# Patient Record
Sex: Female | Born: 1980 | Race: White | Hispanic: No | Marital: Married | Smoking: Never smoker
Health system: Southern US, Community
[De-identification: ages and names within clinical notes are randomized; demographics above are authoritative.]

## PROBLEM LIST (undated history)

## (undated) DIAGNOSIS — N200 Calculus of kidney: Secondary | ICD-10-CM

## (undated) DIAGNOSIS — N289 Disorder of kidney and ureter, unspecified: Secondary | ICD-10-CM

## (undated) DIAGNOSIS — K219 Gastro-esophageal reflux disease without esophagitis: Secondary | ICD-10-CM

## (undated) HISTORY — PX: BREAST REDUCTION SURGERY: SHX8

---

## 1998-08-08 ENCOUNTER — Other Ambulatory Visit: Admission: RE | Admit: 1998-08-08 | Discharge: 1998-08-08 | Payer: Self-pay | Admitting: Obstetrics and Gynecology

## 1999-09-21 ENCOUNTER — Other Ambulatory Visit: Admission: RE | Admit: 1999-09-21 | Discharge: 1999-09-21 | Payer: Self-pay | Admitting: Obstetrics and Gynecology

## 2000-11-07 ENCOUNTER — Other Ambulatory Visit: Admission: RE | Admit: 2000-11-07 | Discharge: 2000-11-07 | Payer: Self-pay | Admitting: Obstetrics and Gynecology

## 2000-12-12 ENCOUNTER — Other Ambulatory Visit: Admission: RE | Admit: 2000-12-12 | Discharge: 2000-12-12 | Payer: Self-pay | Admitting: Obstetrics and Gynecology

## 2001-05-01 ENCOUNTER — Other Ambulatory Visit: Admission: RE | Admit: 2001-05-01 | Discharge: 2001-05-01 | Payer: Self-pay | Admitting: Obstetrics and Gynecology

## 2001-10-21 ENCOUNTER — Emergency Department (HOSPITAL_COMMUNITY): Admission: EM | Admit: 2001-10-21 | Discharge: 2001-10-21 | Payer: Self-pay | Admitting: Emergency Medicine

## 2001-10-21 ENCOUNTER — Encounter: Payer: Self-pay | Admitting: Emergency Medicine

## 2001-11-27 ENCOUNTER — Other Ambulatory Visit: Admission: RE | Admit: 2001-11-27 | Discharge: 2001-11-27 | Payer: Self-pay | Admitting: Obstetrics and Gynecology

## 2001-12-02 ENCOUNTER — Encounter: Payer: Self-pay | Admitting: Emergency Medicine

## 2001-12-02 ENCOUNTER — Observation Stay (HOSPITAL_COMMUNITY): Admission: EM | Admit: 2001-12-02 | Discharge: 2001-12-03 | Payer: Self-pay | Admitting: Emergency Medicine

## 2002-07-11 ENCOUNTER — Inpatient Hospital Stay (HOSPITAL_COMMUNITY): Admission: AD | Admit: 2002-07-11 | Discharge: 2002-07-13 | Payer: Self-pay | Admitting: Obstetrics and Gynecology

## 2002-11-08 ENCOUNTER — Ambulatory Visit (HOSPITAL_BASED_OUTPATIENT_CLINIC_OR_DEPARTMENT_OTHER): Admission: RE | Admit: 2002-11-08 | Discharge: 2002-11-08 | Payer: Self-pay | Admitting: Urology

## 2002-11-18 ENCOUNTER — Ambulatory Visit (HOSPITAL_COMMUNITY): Admission: RE | Admit: 2002-11-18 | Discharge: 2002-11-18 | Payer: Self-pay | Admitting: Urology

## 2002-11-18 ENCOUNTER — Encounter: Payer: Self-pay | Admitting: Urology

## 2003-02-11 ENCOUNTER — Emergency Department (HOSPITAL_COMMUNITY): Admission: EM | Admit: 2003-02-11 | Discharge: 2003-02-12 | Payer: Self-pay | Admitting: Emergency Medicine

## 2003-03-04 ENCOUNTER — Other Ambulatory Visit: Admission: RE | Admit: 2003-03-04 | Discharge: 2003-03-04 | Payer: Self-pay | Admitting: Obstetrics and Gynecology

## 2004-03-23 ENCOUNTER — Other Ambulatory Visit: Admission: RE | Admit: 2004-03-23 | Discharge: 2004-03-23 | Payer: Self-pay | Admitting: Obstetrics and Gynecology

## 2005-05-24 ENCOUNTER — Other Ambulatory Visit: Admission: RE | Admit: 2005-05-24 | Discharge: 2005-05-24 | Payer: Self-pay | Admitting: Obstetrics and Gynecology

## 2006-05-29 ENCOUNTER — Inpatient Hospital Stay (HOSPITAL_COMMUNITY): Admission: AD | Admit: 2006-05-29 | Discharge: 2006-05-29 | Payer: Self-pay | Admitting: Obstetrics and Gynecology

## 2006-06-02 ENCOUNTER — Inpatient Hospital Stay (HOSPITAL_COMMUNITY): Admission: AD | Admit: 2006-06-02 | Discharge: 2006-06-02 | Payer: Self-pay | Admitting: Obstetrics and Gynecology

## 2006-06-06 ENCOUNTER — Inpatient Hospital Stay (HOSPITAL_COMMUNITY): Admission: RE | Admit: 2006-06-06 | Discharge: 2006-06-08 | Payer: Self-pay | Admitting: Obstetrics & Gynecology

## 2007-03-30 ENCOUNTER — Ambulatory Visit (HOSPITAL_COMMUNITY): Admission: RE | Admit: 2007-03-30 | Discharge: 2007-03-30 | Payer: Self-pay | Admitting: Urology

## 2009-03-30 ENCOUNTER — Ambulatory Visit (HOSPITAL_COMMUNITY): Admission: RE | Admit: 2009-03-30 | Discharge: 2009-03-30 | Payer: Self-pay | Admitting: Urology

## 2010-05-16 LAB — PREGNANCY, URINE: Preg Test, Ur: NEGATIVE

## 2010-07-10 NOTE — Op Note (Signed)
NAME:  Heather Knight, Heather Knight                  ACCOUNT NO.:  [id number]   MEDICAL RECORD NO.:  [id number]          PATIENT TYPE:  AMB   LOCATION:  DAY                          FACILITY:  [facility]   PHYSICIAN:  Valetta Fuller, M.D.  DATE OF BIRTH:  [birth year]   DATE OF PROCEDURE:  DATE OF DISCHARGE:                               OPERATIVE REPORT   PREOPERATIVE DIAGNOSIS:  Multiple left ureteral calculi.   POSTOPERATIVE DIAGNOSIS:  Multiple left ureteral calculi.   PROCEDURE PERFORMED:  Cystoscopy, left retrograde pyelography, left  ureteroscopy with holmium laser lithotripsy, basketing of the stone  fragments and double-J stent placement.   SURGEON:  Dr. Barron Alvine.   ANESTHESIA:  General.   INDICATIONS:  Heather Knight is a 30 year old female.  She presented urgently  to our office in my absence and saw one of the nurse practitioners.  She  has had a history of nephrolithiasis and has also had treatment for  stones on several occasions.  She had presented with several days of  left flank pain.  The patient's pain was fairly severe at that time.  An  urgent CT scan revealed what appeared to be two ureteral calculi.  She  had an approximately 5 mm left mid to proximal ureteral calculus and a  smaller 3 mm stone in the distal ureter.  There was moderate  hydronephrosis.  The patient also had a nonobstructing stone in the mid  to lower pole of her right kidney which was 5-6 mm in size.  At that  time I did discuss with the patient after her evaluation with the nurse  practitioner treatment options.  Because of some pending family issues,  she wanted to try to deal with the stone definitively.  Given the  multiple stones, we felt that lithotripsy was probably not her best  option and that she ought to proceed was ureteroscopy.  She appeared to  understand the advantages and disadvantages of that approach.   TECHNIQUE AND FINDINGS:  The patient was brought to the operating room  where she had  successful induction of general anesthesia.  She was  placed in lithotomy position, prepped and draped in the usual manner.  Cystoscopy was performed and was unremarkable.   A retrograde pyelogram was done with an open-ended catheter.  The  patient clearly had a 3-4 mm filling defect in her distal ureter just  above ureterovesical junction.  There was a second filling defect  several centimeters above this with mild to moderate ureteral dilation.  This was interpreted with fluoroscopic interpretation at the time of the  procedure.   After retrograde, a guidewire was placed up to the left renal pelvis.  Initial attempts at engaging the left distal ureter were successful, but  I was unable to really advance the scope more than about 2 cm due to  some edema.  For that reason, I went ahead removed the ureteroscope and  over the guidewire performed a one-step dilation with the inside portion  of an access sheath.  Once that was completed, I was able to easily re-  engage the ureteroscope.  Several centimeters above the ureterovesical  junction, a 3-4 mm stone was encountered.  Just above that there was a 6  mm stone. We felt this would be best treated with holmium laser  lithotripsy.  A 365 micron fiber was placed through the scope.  The  stone was broken up into approximately a dozen pieces. The largest  pieces which were about 2 mm in size were basket extracted.  The rest of  the fragments all flushed out nicely.  There was no evidence of any  ureteral trauma or injury.  Because of the need for dilation, we did  feel that temporary double-J stent placement should be  performed.  Over the guidewire, we placed a 5-French 24 cm Polaris stent  with fluoroscopic as well as visual guidance.  The patient appeared to  tolerate the procedure well.  There did not appear to be any obvious  complications or problems and she was brought to the recovery room in  stable condition.            ______________________________  Valetta Fuller, M.D.  Electronically Signed     DSG/MEDQ  D:  03/30/2007  T:  03/31/2007  Job:  [id number]

## 2010-07-13 NOTE — Op Note (Signed)
NAME:  Heather Knight, Heather Knight                            ACCOUNT NO.:  [id number]   MEDICAL RECORD NO.:  [id number]                   PATIENT TYPE:  AMB   LOCATION:  [facility]                                 FACILITY:  [facility]   PHYSICIAN:  Valetta Fuller, M.D.               DATE OF BIRTH:  1981/01/18   DATE OF PROCEDURE:  11/08/2002  DATE OF DISCHARGE:                                 OPERATIVE REPORT   PREOPERATIVE DIAGNOSES:  1. Right ureteropelvic junction stone.  2. 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  3. Questionable right distal ureteral stone.   POSTOPERATIVE DIAGNOSES:  1. Right ureteropelvic junction obstruction.  2. Right proximal ureteral stone.   OPERATION/PROCEDURE:  1. Cystoscopy.  2. Radical retropubic prostatectomy.  3. Right ureteroscopy.  4. Right double-J stent placement.   SURGEON:  Valetta Fuller, M.D.   ASSISTANT:  Susanne Borders, M.D.   ANESTHESIA:  General endotracheal anesthesia.   COMPLICATIONS:  None.   DRAINS:  7-French x 24 cm double-J ureteral stent.   INDICATIONS FOR PROCEDURE:  Heather Knight is a 30 year old with a history of  right flank pain approximately one year ago when she was pregnant.  There  was a questionable ureteropelvic junction stone at that time.  The patient's  pain resolved and the patient was not heard from until recently when she has  been having right flank pain again and she was referred to Heather Knight.  She  underwent a CT scan which demonstrated significant right hydronephrosis and  a stone at about the level of the right ureteropelvic junction.  There is  also questionable punctate ureterovesical junction stone.  After  understanding the risks and benefits, the patient consented to the above  procedures for possible diagnosis of right ureteropelvic junction  obstruction and possible treatment of a right ureteropelvic junction stone.   DESCRIPTION OF PROCEDURE:  The patient was brought to the operating room and  correctly identified by her identification.  She was given preoperative  antibiotics and general endotracheal anesthesia.  She was placed in the  dorsal lithotomy position and prepped and draped in the typical sterile  fashion.  A 22-French cystoscope sheath with 12-degree lens was used to  enter the patient's urethra and bladder.  Careful systematic cystoscopy was  performed which demonstrated some erythema at the level of the trigone, but  no other mucosal abnormalities.  Bilateral ureteral orifices were identified  in their normal anatomic location, each effluxing clear urine.  A cone-tip  ureteral catheter was used to perform right retrograde pyelogram.  After  injecting approximately 25 mL of contrast, the anatomy of the patient's  right collecting system, ureter was better identified.  The patient had very  significant right hydronephrosis with apparent high insertion of her right  ureter and a transition point at a level of the ureteropelvic junction.  She  also had a second transition point approximately  5 cm distal to the  ureteropelvic junction at which point the stone that was identified on the  CT scan was seen.  There was an area of small segment of narrowing just  distal to the stone.  Past this point, ureter was somewhat dilated all the  way down to the level of the urethrovesical junction.  Next, a 0.38 mm guide  wire was passed through the cystoscope and into the urethra and up into the  right renal pelvis under fluoroscopic guidance.  Cystoscope was removed and  the rigid ureteroscope was passed into the patient's bladder, into her right  ureter.  There was no evidence of calculi in the distal ureter.  The  cystoscope could be passed quite easily all the way up to the level of her  proximal ureter secondary to the ureteronephrosis that was present.  The  area of narrowing in the proximal ureter was seen and this did not appear to  be a stricture per se but possibly edema  from the stone that was just  proximal to it.  The ureteroscope was removed and access sheath was  attempted to be passed up past the stone and into the ureteropelvic junction  but this could not be done.  The ureteroscope was replaced and again we were  not able to pass the scope past this distal area of narrowing.  The stone  could be visualized, however, just proximal to this level.  Decision was  made at that point to place a stent and treat the patient's stone at a later  date with either extracorporeal shock wave lithotripsy.   The ureteroscope was removed and cystoscope was backloaded over the guide  wire.  A 7-French x 24 cm double-J stent was passed over the guide wire.  The proximal end was curled in the right renal pelvis as visualized on  fluoroscopy and the distal end curled nicely in the bladder as seen under  direct vision.  The patient's bladder was then drained and the cystoscope  was removed.  A B&O suppository was administered.  The patient was awakened  from her anesthesia without complications and taken to the post anesthesia  care unit.   Please note that Heather Knight was present and participated in all aspects of  this case as he was the primary surgeon.        Susanne Borders, MD                           Valetta Fuller, M.D.    DR/MEDQ  D:  11/08/2002  T:  11/08/2002  Job:  [id number]

## 2010-11-16 LAB — HEMOGLOBIN AND HEMATOCRIT, BLOOD: Hemoglobin: 12.9

## 2012-08-16 ENCOUNTER — Encounter (HOSPITAL_COMMUNITY): Payer: Self-pay

## 2012-08-16 ENCOUNTER — Emergency Department (HOSPITAL_COMMUNITY)
Admission: EM | Admit: 2012-08-16 | Discharge: 2012-08-16 | Disposition: A | Discharge status: Home / Self Care | Payer: 59 | Attending: Emergency Medicine | Admitting: Emergency Medicine

## 2012-08-16 ENCOUNTER — Emergency Department (HOSPITAL_COMMUNITY): Payer: 59

## 2012-08-16 DIAGNOSIS — Z79899 Other long term (current) drug therapy: Secondary | ICD-10-CM | POA: Insufficient documentation

## 2012-08-16 DIAGNOSIS — R1013 Epigastric pain: Secondary | ICD-10-CM | POA: Insufficient documentation

## 2012-08-16 DIAGNOSIS — Z3202 Encounter for pregnancy test, result negative: Secondary | ICD-10-CM | POA: Insufficient documentation

## 2012-08-16 DIAGNOSIS — R110 Nausea: Secondary | ICD-10-CM | POA: Insufficient documentation

## 2012-08-16 DIAGNOSIS — Z87448 Personal history of other diseases of urinary system: Secondary | ICD-10-CM | POA: Insufficient documentation

## 2012-08-16 HISTORY — DX: Disorder of kidney and ureter, unspecified: N28.9

## 2012-08-16 LAB — POCT I-STAT TROPONIN I: Troponin i, poc: 0.02 ng/mL (ref 0.00–0.08)

## 2012-08-16 LAB — HEPATIC FUNCTION PANEL
Albumin: 3.7 g/dL (ref 3.5–5.2)
Total Protein: 6.7 g/dL (ref 6.0–8.3)

## 2012-08-16 LAB — URINALYSIS, ROUTINE W REFLEX MICROSCOPIC
Bilirubin Urine: NEGATIVE
Glucose, UA: NEGATIVE mg/dL
Hgb urine dipstick: NEGATIVE
Ketones, ur: NEGATIVE mg/dL
Protein, ur: NEGATIVE mg/dL

## 2012-08-16 LAB — BASIC METABOLIC PANEL
BUN: 6 mg/dL (ref 6–23)
Calcium: 8.9 mg/dL (ref 8.4–10.5)
GFR calc non Af Amer: >90 mL/min (ref >90)
Glucose, Bld: 93 mg/dL (ref 70–99)
Sodium: 137 mEq/L (ref 135–145)

## 2012-08-16 LAB — URINE MICROSCOPIC-ADD ON

## 2012-08-16 LAB — CBC
Hemoglobin: 14.7 g/dL (ref 12.0–15.0)
MCH: 31.1 pg (ref 26.0–34.0)
MCHC: 35.5 g/dL (ref 30.0–36.0)

## 2012-08-16 LAB — D-DIMER, QUANTITATIVE: D-Dimer, Quant: 0.28 ug/mL-FEU (ref 0.00–0.48)

## 2012-08-16 MED ORDER — FAMOTIDINE IN NACL 20-0.9 MG/50ML-% IV SOLN: 20.0000 mg | Freq: Once | INTRAVENOUS | Status: DC

## 2012-08-16 MED ORDER — ASPIRIN 81 MG PO CHEW
324.0000 mg | CHEWABLE_TABLET | Freq: Once | ORAL | Status: AC
  Administered 2012-08-16: 324 mg via ORAL
  Filled 2012-08-16: qty 4

## 2012-08-16 MED ORDER — GI COCKTAIL ~~LOC~~
30.0000 mL | Freq: Once | ORAL | Status: AC
  Administered 2012-08-16: 30 mL via ORAL
  Filled 2012-08-16: qty 30

## 2012-08-16 MED ORDER — OMEPRAZOLE 20 MG PO CPDR: 20.0000 mg | DELAYED_RELEASE_CAPSULE | Freq: Every day | ORAL | Status: DC

## 2012-08-16 MED ORDER — FAMOTIDINE 20 MG PO TABS
20.0000 mg | ORAL_TABLET | Freq: Once | ORAL | Status: AC
  Administered 2012-08-16: 20 mg via ORAL
  Filled 2012-08-16: qty 1

## 2012-08-16 NOTE — ED Notes (Signed)
Pt. Developed upper abdominal pain yesterday radiates into her rt. Upper quadrant and into her chest with pain that radiates into her rt. Shoulder and rt. Arm,  Described as a dull ache with nausea.  Pt. Skin in p/w/d/,  resp. E/U, Pt. Is alert and oriented X4

## 2012-08-16 NOTE — ED Notes (Signed)
Pt transported to Xray. 

## 2012-08-16 NOTE — ED Provider Notes (Signed)
History     CSN: [id number]  Arrival date & time 08/16/12  5784   First MD Initiated Contact with Patient 08/16/12 1025      Chief Complaint  Patient presents with  . Chest Pain    (Consider location/radiation/quality/duration/timing/severity/associated sxs/prior treatment) HPI Comments: Patient presents today with a chief complaint of epigastric abdominal pain that has been present since yesterday afternoon.  She reports that the pain has been constant since, but at times worsens.  She has not noticed that anything in particular worsens the pain.  She reports that the pain radiates into her chest.  She describes the pain as a dull ache.  She reports that she has never had pain like this previously. Pain associated with some nausea, but no vomiting.  Denies diarrhea.  She has been having regular bowel movements.  Denies fever or chills.  Denies cough.  Denies shortness of breath.  Denies dizziness, lightheadedness, or syncope.  She denies prior history of Cardiac Disease.  No history of HTN, DM, or Hyperlipidemia.  No history of Gallbladder Disease.  No known history of GERD.  No history of PE or DVT.  No prolonged surgeries or travel over the past 4 weeds.  She currently is on Seasonal birth control.  Denies swelling or pain of the lower extremities.    The history is provided by the patient.    Past Medical History  Diagnosis Date  . Renal disorder     History reviewed. No pertinent past surgical history.  No family history on file.  History  Substance Use Topics  . Smoking status: Never Smoker   . Smokeless tobacco: Not on file  . Alcohol Use: No    OB History   Grav Para Term Preterm Abortions TAB SAB Ect Mult Living                  Review of Systems  All other systems reviewed and are negative.    Allergies  Review of patient's allergies indicates no known allergies.  Home Medications   Current Outpatient Rx  Name  Route  Sig  Dispense  Refill  .  phentermine 37.5 MG capsule   Oral   Take 37.5 mg by mouth every morning.           BP 126/70  Pulse 73  Temp(Src) 98.2 F (36.8 C) (Oral)  Resp 21  SpO2 100%  LMP 06/15/2012  Physical Exam  Nursing note and vitals reviewed. Constitutional: She appears well-developed and well-nourished. No distress.  HENT:  Head: Normocephalic and atraumatic.  Mouth/Throat: Oropharynx is clear and moist.  Neck: Normal range of motion. Neck supple.  Cardiovascular: Normal rate, regular rhythm and normal heart sounds.   Pulmonary/Chest: Effort normal and breath sounds normal. No respiratory distress. She has no wheezes. She has no rales.  Abdominal: Soft. Normal appearance and bowel sounds are normal. She exhibits no distension and no mass. There is tenderness in the epigastric area. There is no rigidity, no rebound, no guarding, no tenderness at McBurney's point and negative Murphy's sign.  Neurological: She is alert.  Skin: Skin is warm and dry. She is not diaphoretic.  Psychiatric: She has a normal mood and affect.    ED Course  Procedures (including critical care time)  Labs Reviewed  CBC  BASIC METABOLIC PANEL  LIPASE, BLOOD  URINALYSIS, ROUTINE W REFLEX MICROSCOPIC  POCT PREGNANCY, URINE  POCT I-STAT TROPONIN I    Date: 08/17/2012  Rate: 85  Rhythm: normal  sinus rhythm  QRS Axis: normal  Intervals: normal  ST/T Wave abnormalities: normal  Conduction Disutrbances:none  Narrative Interpretation:   Old EKG Reviewed: none available     No diagnosis found.  12:30 PM Reassessed patient.  She reports that her pain has completely resolved after getting the GI cocktail.  MDM  Patient presents today with a chief complaint of epigastric abdominal pain that radiates into her chest.  Patient afebrile.  Labs unremarkable.  UA and Urine pregnancy negative.  EKG unremarkable.  CXR negative.  D-dimer negative.  No rebound or guarding on abdominal exam.  Negative Murphy's sign.   Symptoms completely resolved after given GI cocktail.  Therefore, do not feel that any further work up is indicated at this time.  Patient instructed to begin taking a PPI and given referral to GI.          Pascal Lux Bancroft, PA-C 08/17/12 1423

## 2012-08-17 ENCOUNTER — Encounter (HOSPITAL_COMMUNITY): Payer: Self-pay | Admitting: Emergency Medicine

## 2012-08-17 ENCOUNTER — Telehealth: Payer: Self-pay | Admitting: Internal Medicine

## 2012-08-17 ENCOUNTER — Emergency Department (HOSPITAL_COMMUNITY): Payer: 59

## 2012-08-17 ENCOUNTER — Emergency Department (HOSPITAL_COMMUNITY)
Admission: EM | Admit: 2012-08-17 | Discharge: 2012-08-17 | Disposition: A | Discharge status: Home / Self Care | Payer: 59 | Attending: Emergency Medicine | Admitting: Emergency Medicine

## 2012-08-17 DIAGNOSIS — Z79899 Other long term (current) drug therapy: Secondary | ICD-10-CM | POA: Insufficient documentation

## 2012-08-17 DIAGNOSIS — R110 Nausea: Secondary | ICD-10-CM | POA: Insufficient documentation

## 2012-08-17 DIAGNOSIS — Z87448 Personal history of other diseases of urinary system: Secondary | ICD-10-CM | POA: Insufficient documentation

## 2012-08-17 DIAGNOSIS — R109 Unspecified abdominal pain: Secondary | ICD-10-CM

## 2012-08-17 DIAGNOSIS — R079 Chest pain, unspecified: Secondary | ICD-10-CM | POA: Insufficient documentation

## 2012-08-17 MED ORDER — HYDROMORPHONE HCL PF 1 MG/ML IJ SOLN
1.0000 mg | Freq: Once | INTRAMUSCULAR | Status: AC
  Administered 2012-08-17: 1 mg via INTRAMUSCULAR
  Filled 2012-08-17: qty 1

## 2012-08-17 MED ORDER — PROMETHAZINE HCL 25 MG PO TABS: 25.0000 mg | ORAL_TABLET | Freq: Four times a day (QID) | ORAL | Status: DC | PRN

## 2012-08-17 MED ORDER — HYDROCODONE-ACETAMINOPHEN 5-325 MG PO TABS: ORAL_TABLET | ORAL | Status: DC

## 2012-08-17 MED ORDER — PANTOPRAZOLE SODIUM 40 MG PO TBEC: 40.0000 mg | DELAYED_RELEASE_TABLET | Freq: Every day | ORAL | Status: DC

## 2012-08-17 NOTE — Discharge Instructions (Signed)
 Abdominal Pain Abdominal pain can be caused by many things. Your caregiver decides the seriousness of your pain by an examination and possibly blood tests and X-rays. Many cases can be observed and treated at home. Most abdominal pain is not caused by a disease and will probably improve without treatment. However, in many cases, more time must pass before a clear cause of the pain can be found. Before that point, it may not be known if you need more testing, or if hospitalization or surgery is needed. HOME CARE INSTRUCTIONS   Do not take laxatives unless directed by your caregiver.  Take pain medicine only as directed by your caregiver.  Only take over-the-counter or prescription medicines for pain, discomfort, or fever as directed by your caregiver.  Try a clear liquid diet (broth, tea, or water) for as long as directed by your caregiver. Slowly move to a bland diet as tolerated. SEEK IMMEDIATE MEDICAL CARE IF:   The pain does not go away.  You have a fever.  You keep throwing up (vomiting).  The pain is felt only in portions of the abdomen. Pain in the right side could possibly be appendicitis. In an adult, pain in the left lower portion of the abdomen could be colitis or diverticulitis.  You pass bloody or black tarry stools. MAKE SURE YOU:   Understand these instructions.  Will watch your condition.  Will get help right away if you are not doing well or get worse. Document Released: 11/21/2004 Document Revised: 05/06/2011 Document Reviewed: 09/30/2007 [facility] Patient Information 2014 [place], [place].     Narcotic and benzodiazepine use may cause drowsiness, slowed breathing or dependence.  Please use with caution and do not drive, operate machinery or watch young children alone while taking them.  Taking combinations of these medications or drinking alcohol will potentiate these effects.

## 2012-08-17 NOTE — ED Provider Notes (Signed)
History     CSN: [id number]  Arrival date & time 08/17/12  0709   First MD Initiated Contact with Patient 08/17/12 [phone]      Chief Complaint  Patient presents with  . Abdominal Pain  . Chest Pain    (Consider location/radiation/quality/duration/timing/severity/associated sxs/prior treatment) Patient is a 32 y.o. female presenting with abdominal pain. The history is provided by the patient and the spouse.  Abdominal Pain This is a new problem. The current episode started yesterday. Episode frequency: every few hours, has occurred 3-4 times thus far. Progression since onset: Currently, has no symptoms. Associated symptoms include chest pain and abdominal pain. Pertinent negatives include no shortness of breath. Associated symptoms comments: Radiates into anterior sternal chest area towards throat. Nothing aggravates the symptoms. Relieved by: Yesterday in the emergency department, was seen for same, resolved with GI cocktail.    Past Medical History  Diagnosis Date  . Renal disorder     History reviewed. No pertinent past surgical history.  History reviewed. No pertinent family history.  History  Substance Use Topics  . Smoking status: Never Smoker   . Smokeless tobacco: Not on file  . Alcohol Use: No    OB History   Grav Para Term Preterm Abortions TAB SAB Ect Mult Living                  Review of Systems  Constitutional: Negative for fever, chills and appetite change.  HENT: Negative for congestion and rhinorrhea.   Respiratory: Negative for cough, shortness of breath and wheezing.   Cardiovascular: Positive for chest pain. Negative for palpitations and leg swelling.  Gastrointestinal: Positive for nausea and abdominal pain. Negative for diarrhea, constipation and blood in stool.  Genitourinary: Negative for dysuria and pelvic pain.  Musculoskeletal: Negative for back pain.  All other systems reviewed and are negative.    Allergies  Review of patient's  allergies indicates no known allergies.  Home Medications   Current Outpatient Rx  Name  Route  Sig  Dispense  Refill  . ibuprofen (ADVIL,MOTRIN) 200 MG tablet   Oral   Take 800 mg by mouth every 6 (six) hours as needed for pain.         Marland Kitchen levonorgestrel-ethinyl estradiol (SEASONALE,INTROVALE,JOLESSA) 0.15-0.03 MG tablet   Oral   Take 1 tablet by mouth daily.         Marland Kitchen omeprazole (PRILOSEC) 20 MG capsule   Oral   Take 1 capsule (20 mg total) by mouth daily.   30 capsule   0   . phentermine 37.5 MG capsule   Oral   Take 37.5 mg by mouth every morning.         Marland Kitchen HYDROcodone-acetaminophen (NORCO/VICODIN) 5-325 MG per tablet      1-2 tablets po q 6 hours prn moderate to severe pain   20 tablet   0   . pantoprazole (PROTONIX) 40 MG tablet   Oral   Take 1 tablet (40 mg total) by mouth daily.   14 tablet   0   . promethazine (PHENERGAN) 25 MG tablet   Oral   Take 1 tablet (25 mg total) by mouth every 6 (six) hours as needed for nausea.   20 tablet   0     BP 109/73  Pulse 63  Temp(Src) 98.1 F (36.7 C) (Oral)  Resp 14  SpO2 99%  LMP 06/15/2012  Physical Exam  Nursing note and vitals reviewed. Constitutional: She is oriented to person, place, and  time. She appears well-developed and well-nourished. No distress.  HENT:  Head: Normocephalic and atraumatic.  Eyes: Conjunctivae and EOM are normal. No scleral icterus.  Neck: Neck supple.  Cardiovascular: Normal rate, regular rhythm and intact distal pulses.   No murmur heard. Pulmonary/Chest: Effort normal. No respiratory distress. She has no wheezes.  Abdominal: Soft. Bowel sounds are normal. She exhibits no distension.  Neurological: She is alert and oriented to person, place, and time. She exhibits normal muscle tone. Coordination normal.  Skin: Skin is warm and dry. No rash noted. She is not diaphoretic.  Psychiatric: She has a normal mood and affect.    ED Course  Procedures (including critical care  time)  Labs Reviewed - No data to display Dg Chest 2 View  08/16/2012   *RADIOLOGY REPORT*  Clinical Data: Chest pain  CHEST - 2 VIEW  Comparison: None  Findings: The heart size and mediastinal contours are within normal limits.  Both lungs are clear.  The visualized skeletal structures are unremarkable.  IMPRESSION: Negative exam.   Original Report Authenticated By: Signa Kell, M.D.   US Abdomen Complete  08/17/2012   *RADIOLOGY REPORT*  Clinical Data:  Abdominal pain.  Epigastric pain.  Nausea.  COMPLETE ABDOMINAL ULTRASOUND  Comparison:  Abdominal CT 03/26/2007  Findings:  Gallbladder:  Normal without stones, sludge or wall thickening.  Common bile duct:  Normal at 2 mm.  Liver:  Normal echogenicity.  No focal lesions.  No biliary ductal dilatation.  IVC:  Normal  Pancreas:  Normal  Spleen:  Normal at 6 cm.  Right Kidney:  12.2 cm in length. Normal echogenicity .  No mass or cyst.Mild fullness of the renal collecting system.  Possible 5 mm stone in the mid to lower portion of the kidney.  Left Kidney:  10.5 cm in length.  Normal echogenicity.  Mild fullness of the renal collecting system.  No cyst, mass, stone on this side.  Abdominal aorta:  Normal  Ureteral jet demonstrated on the right but not on the left. Significance is doubtful.  IMPRESSION: No cause of abdominal pain and nausea is identified.  Mild fullness of the renal collecting systems bilaterally of doubtful significance.  In the past, the patient has had hydronephrosis and this could be residual from those times.  The patient does not appear to have a 5 mm nonobstructing stone in the right kidney.   Original Report Authenticated By: Paulina Fusi, M.D.     1. Abdominal cramps     ECG at time 0712 shows NSR at rate 96, normal axis, normal intervals, no ST abn's, normal ECG.      8:56 AM Pt just had another spastic episode of pain, had upper epigastric tenderness, mild, no guarding, soft abdomen, neg Murphy's sign.  Will get U/S and  pt ordered IM analgesics for now.     11:10 AM Pt feels improved, U/S is negative for biliary disease.  Pt will get referral to GI, encouraged taking a laxative, taking PPI, will also give Rx for pain and nausea to take as needed.  Pt and family understand plan and agreeable.    MDM  I reviewed labs and CXR from yesterday.  Pt is asymp[tomatic now, looks and feels well.  Pt did not have an U/S yesterday for possible biliary disease, will obtain this AM now.  Otherwise, taking PPI, other symptomatic treatments and referral to GI as outpt if negative [place]        Gavin Pound. Taiwana Willison, MD 08/17/12  1111 

## 2012-08-17 NOTE — ED Provider Notes (Signed)
Medical screening examination/treatment/procedure(s) were performed by non-physician practitioner and as supervising physician I was immediately available for consultation/collaboration.  Ayinde Swim R. Avagrace Botelho, MD 08/17/12 2106 

## 2012-08-17 NOTE — ED Notes (Signed)
Pt c/o epigastric pain into chest x 3 days; pt seen here for same yesterday; pt sts pain is intermittent

## 2012-08-17 NOTE — Telephone Encounter (Signed)
Patient scheduled for 08/18/12 1:30 with Doug Sou, PA, she will call back and cancel if she is not able to keep this appt

## 2012-08-18 ENCOUNTER — Ambulatory Visit: Payer: 59 | Admitting: Gastroenterology

## 2013-12-16 ENCOUNTER — Other Ambulatory Visit (HOSPITAL_COMMUNITY): Payer: Self-pay | Admitting: Obstetrics and Gynecology

## 2013-12-16 DIAGNOSIS — O351XX0 Maternal care for (suspected) chromosomal abnormality in fetus, not applicable or unspecified: Secondary | ICD-10-CM

## 2013-12-16 DIAGNOSIS — IMO0002 Reserved for concepts with insufficient information to code with codable children: Secondary | ICD-10-CM

## 2013-12-16 DIAGNOSIS — R1909 Other intra-abdominal and pelvic swelling, mass and lump: Secondary | ICD-10-CM

## 2013-12-16 DIAGNOSIS — R188 Other ascites: Secondary | ICD-10-CM

## 2013-12-17 ENCOUNTER — Ambulatory Visit (HOSPITAL_COMMUNITY)
Admission: RE | Admit: 2013-12-17 | Discharge: 2013-12-17 | Disposition: A | Discharge status: Home / Self Care | Payer: 59 | Source: Ambulatory Visit | Attending: Obstetrics and Gynecology | Admitting: Obstetrics and Gynecology

## 2013-12-17 ENCOUNTER — Other Ambulatory Visit (HOSPITAL_COMMUNITY): Payer: Self-pay | Admitting: Obstetrics and Gynecology

## 2013-12-17 ENCOUNTER — Encounter (HOSPITAL_COMMUNITY): Payer: Self-pay

## 2013-12-17 ENCOUNTER — Other Ambulatory Visit (HOSPITAL_COMMUNITY): Payer: Self-pay

## 2013-12-17 VITALS — BP 126/53 | HR 97 | Wt 154.2 lb

## 2013-12-17 DIAGNOSIS — R1909 Other intra-abdominal and pelvic swelling, mass and lump: Secondary | ICD-10-CM

## 2013-12-17 DIAGNOSIS — Z3A11 11 weeks gestation of pregnancy: Secondary | ICD-10-CM | POA: Insufficient documentation

## 2013-12-17 DIAGNOSIS — R188 Other ascites: Secondary | ICD-10-CM

## 2013-12-17 DIAGNOSIS — IMO0002 Reserved for concepts with insufficient information to code with codable children: Secondary | ICD-10-CM

## 2013-12-17 DIAGNOSIS — O351XX0 Maternal care for (suspected) chromosomal abnormality in fetus, not applicable or unspecified: Secondary | ICD-10-CM

## 2013-12-17 DIAGNOSIS — O358XX0 Maternal care for other (suspected) fetal abnormality and damage, not applicable or unspecified: Secondary | ICD-10-CM | POA: Insufficient documentation

## 2013-12-27 ENCOUNTER — Encounter (HOSPITAL_COMMUNITY): Payer: Self-pay

## 2013-12-28 ENCOUNTER — Encounter (HOSPITAL_COMMUNITY): Payer: Self-pay

## 2013-12-28 ENCOUNTER — Ambulatory Visit (HOSPITAL_COMMUNITY)
Admission: RE | Admit: 2013-12-28 | Discharge: 2013-12-28 | Disposition: A | Discharge status: Home / Self Care | Payer: 59 | Source: Ambulatory Visit | Attending: Obstetrics & Gynecology | Admitting: Obstetrics & Gynecology

## 2013-12-28 VITALS — BP 126/66 | HR 96 | Wt 158.0 lb

## 2013-12-28 DIAGNOSIS — IMO0002 Reserved for concepts with insufficient information to code with codable children: Secondary | ICD-10-CM | POA: Insufficient documentation

## 2013-12-28 DIAGNOSIS — Z315 Encounter for genetic counseling: Secondary | ICD-10-CM | POA: Insufficient documentation

## 2013-12-28 DIAGNOSIS — O351XX0 Maternal care for (suspected) chromosomal abnormality in fetus, not applicable or unspecified: Secondary | ICD-10-CM | POA: Insufficient documentation

## 2013-12-28 DIAGNOSIS — R1909 Other intra-abdominal and pelvic swelling, mass and lump: Secondary | ICD-10-CM

## 2013-12-28 DIAGNOSIS — O3510X0 Maternal care for (suspected) chromosomal abnormality in fetus, unspecified, not applicable or unspecified: Secondary | ICD-10-CM | POA: Insufficient documentation

## 2013-12-28 DIAGNOSIS — Z3A12 12 weeks gestation of pregnancy: Secondary | ICD-10-CM | POA: Insufficient documentation

## 2013-12-28 NOTE — Progress Notes (Signed)
Genetic Counseling  High-Risk Gestation Note  Appointment Date:  12/28/2013 Referred By: Heather Butters, MD Date of Birth:  07-Jan-1981 Partner:  Heather Knight   Pregnancy History: [id number] Estimated Date of Delivery: 07/07/14 Estimated Gestational Age: [redacted]w[redacted]d Attending: Benjaman Lobe, MD  I met with [name] and her husband, [name], for genetic counseling because of an increased risk for fetal trisomy 13 based on noninvasive prenatal screening (NIPS)/cell free fetal DNA (cffDNA) test results and previous ultrasound finding of cystic hygroma.   Heather Knight was offered NIPS/cfDNA testing through her primary obstetrician's office, because of her maternal age of 33 y.o. and ultrasound finding of increased nuchal translucency. Heather Knight was previously seen [facility], which confirmed the finding of cystic hygroma. Follow-up ultrasound was performed today and again visualized cystic hygroma and possible omphalocele. The report will be sent under separate cover.   We reviewed the results of NIPS (Panorama performed through [facility] laboratory). The results showed a greater than 99% risk of fetal trisomy 13. We reviewed that this screen detects approximately 99% of pregnancies with trisomy 55 and has a false positive rate of less than 1%. 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   We reviewed that cffDNA testing can not distinguish between aneuploidy confined to the placenta, trisomy 85, partial trisomy 44, translocation trisomy 25, or mosaic trisomy 32.  We discussed the availability of amniocentesis as well as postnatal chromosome analysis for confirmation of the suspected diagnosis.  We reviewed the benefits and limitations of these testing options. The couple was undecided regarding amniocentesis but stated that they were  not likely planning to pursue this testing option at this time.  Considering the very high suspicion of trisomy 36, they were counseled in detail regarding this diagnosis.    We discussed that trisomy 62 represents the third most common autosomal trisomy, after Down syndrome and trisomy 64, and occurs in 1 in 12,000-1 in 20,000 livebirths. The prevalence is estimated to be much higher when pregnancy terminations, stillbirths, and miscarriages are included in the estimate of incidence. The couple was counseled that trisomy 27 results from meiotic nondisjunction involving the 13th pair of chromosomes in ~90% of cases. We reviewed chromosomes, nondisjunction, and the role of maternal age in providing risk estimates for fetal aneuploidy.   Trisomy 13 is characterized by a specific pattern of cardinal features including: orofacial clefts, microphthalmia/anophthalmia, and postaxial polydactyly. Holoprosencephaly (60-70%), congenital heart defects (80%), and various other organ system anomalies are also commonly observed. In addition, we discussed that prenatal growth restriction is typically observed in fetuses with trisomy 78 and that survivors have significant neurodevelopmental delay that results in profound mental retardation. Features of trisomy 18 provide a generalized overview of the condition; however, each child with trisomy 18 is unique and an individualized healthcare plan must be established based on the needs of that child.  They were counseled that the prognosis for trisomy 13 is very guarded. They understand that pregnancies with trisomy 13 have a significantly increased risk for miscarriage (~50%) and stillbirth. Of those that survive to term, ~90% of infants die during the first year of life. We discussed that a small percentage of children with trisomy 2 do indeed survive, some well beyond a year of life and require additional medical assistance. We discussed that neonates with trisomy 13 most  often  pass away from respiratory concerns secondary to congenital heart disease.   We discussed options for the pregnancy including termination of pregnancy versus continuing the pregnancy with expectant management. The limit for termination of pregnancy in [place] is [redacted] weeks gestation. The couple indicated that they had previously read about various trisomy conditions, including trisomy 107, and after much consideration are leaning towards termination of the pregnancy. Heather Knight planned to discuss pregnancy options further with her OB at her next visit this week. We encouraged the couple to contact us with additional questions or concerns and if we can provide additional resources or support to them. They were provided with educational resources today. The couple stated that they have a good support system between and around them.   Given the nature of today's visit, family history review was declined by the couple at this time.   I counseled this couple regarding the above risks and available options.  The approximate face-to-face time with the genetic counselor was 40 minutes.      Heather Oman, MS Certified Genetic Counselor 12/28/2013

## 2014-01-03 ENCOUNTER — Ambulatory Visit (HOSPITAL_COMMUNITY): Payer: 59 | Admitting: Anesthesiology

## 2014-01-03 ENCOUNTER — Encounter (HOSPITAL_COMMUNITY): Payer: Self-pay | Admitting: *Deleted

## 2014-01-03 ENCOUNTER — Encounter (HOSPITAL_COMMUNITY)
Admission: RE | Disposition: A | Discharge status: Home / Self Care | Payer: Self-pay | Source: Ambulatory Visit | Attending: Obstetrics and Gynecology

## 2014-01-03 ENCOUNTER — Ambulatory Visit (HOSPITAL_COMMUNITY)
Admission: RE | Admit: 2014-01-03 | Discharge: 2014-01-03 | Disposition: A | Discharge status: Home / Self Care | Payer: 59 | Source: Ambulatory Visit | Attending: Obstetrics and Gynecology | Admitting: Obstetrics and Gynecology

## 2014-01-03 DIAGNOSIS — Z332 Encounter for elective termination of pregnancy: Secondary | ICD-10-CM | POA: Insufficient documentation

## 2014-01-03 HISTORY — PX: DILATION AND EVACUATION: SHX1459

## 2014-01-03 LAB — CBC
HCT: 36.1 % (ref 36.0–46.0)
Hemoglobin: 12.7 g/dL (ref 12.0–15.0)
MCH: 31.4 pg (ref 26.0–34.0)
MCHC: 35.2 g/dL (ref 30.0–36.0)
MCV: 89.1 fL (ref 78.0–100.0)
Platelets: 157 10*3/uL (ref 150–400)
RBC: 4.05 MIL/uL (ref 3.87–5.11)
RDW: 12.6 % (ref 11.5–15.5)
WBC: 8.9 10*3/uL (ref 4.0–10.5)

## 2014-01-03 LAB — ABO/RH: ABO/RH(D): O POS

## 2014-01-03 SURGERY — DILATION AND EVACUATION, UTERUS
Anesthesia: Monitor Anesthesia Care

## 2014-01-03 MED ORDER — MIDAZOLAM HCL 2 MG/2ML IJ SOLN
INTRAMUSCULAR | Status: AC
  Filled 2014-01-03: qty 2

## 2014-01-03 MED ORDER — MEPERIDINE HCL 25 MG/ML IJ SOLN: 6.2500 mg | INTRAMUSCULAR | Status: DC | PRN

## 2014-01-03 MED ORDER — SCOPOLAMINE 1 MG/3DAYS TD PT72
MEDICATED_PATCH | TRANSDERMAL | Status: AC
  Administered 2014-01-03: 1.5 mg via TRANSDERMAL
  Filled 2014-01-03: qty 1

## 2014-01-03 MED ORDER — KETOROLAC TROMETHAMINE 30 MG/ML IJ SOLN
INTRAMUSCULAR | Status: DC | PRN
  Administered 2014-01-03: 30 mg via INTRAVENOUS

## 2014-01-03 MED ORDER — SCOPOLAMINE 1 MG/3DAYS TD PT72: 1.0000 | MEDICATED_PATCH | Freq: Once | TRANSDERMAL | Status: DC

## 2014-01-03 MED ORDER — FENTANYL CITRATE 0.05 MG/ML IJ SOLN
25.0000 ug | INTRAMUSCULAR | Status: DC | PRN
Start: 2014-01-03 — End: 2014-01-03

## 2014-01-03 MED ORDER — LIDOCAINE HCL (CARDIAC) 20 MG/ML IV SOLN
INTRAVENOUS | Status: DC | PRN
  Administered 2014-01-03: 30 mg via INTRAVENOUS

## 2014-01-03 MED ORDER — ONDANSETRON HCL 4 MG/2ML IJ SOLN
INTRAMUSCULAR | Status: DC | PRN
  Administered 2014-01-03: 4 mg via INTRAVENOUS

## 2014-01-03 MED ORDER — OXYCODONE-ACETAMINOPHEN 5-325 MG PO TABS: 2.0000 | ORAL_TABLET | ORAL | Status: DC | PRN

## 2014-01-03 MED ORDER — DEXAMETHASONE SODIUM PHOSPHATE 10 MG/ML IJ SOLN
INTRAMUSCULAR | Status: AC
  Filled 2014-01-03: qty 1

## 2014-01-03 MED ORDER — PROPOFOL 10 MG/ML IV BOLUS
INTRAVENOUS | Status: DC | PRN
  Administered 2014-01-03: 30 mg via INTRAVENOUS
  Administered 2014-01-03 (×2): 50 mg via INTRAVENOUS
  Administered 2014-01-03: 20 mg via INTRAVENOUS
  Administered 2014-01-03 (×3): 30 mg via INTRAVENOUS

## 2014-01-03 MED ORDER — KETOROLAC TROMETHAMINE 30 MG/ML IJ SOLN
INTRAMUSCULAR | Status: AC
  Filled 2014-01-03: qty 1

## 2014-01-03 MED ORDER — LACTATED RINGERS IV SOLN
INTRAVENOUS | Status: DC
Start: 2014-01-03 — End: 2014-01-03
  Administered 2014-01-03: 12:00:00 via INTRAVENOUS

## 2014-01-03 MED ORDER — LIDOCAINE HCL (CARDIAC) 20 MG/ML IV SOLN
INTRAVENOUS | Status: AC
  Filled 2014-01-03: qty 5

## 2014-01-03 MED ORDER — PROMETHAZINE HCL 25 MG/ML IJ SOLN: 6.2500 mg | INTRAMUSCULAR | Status: DC | PRN

## 2014-01-03 MED ORDER — LIDOCAINE HCL 2 % IJ SOLN
INTRAMUSCULAR | Status: AC
  Filled 2014-01-03: qty 20

## 2014-01-03 MED ORDER — LIDOCAINE HCL 1 % IJ SOLN
INTRAMUSCULAR | Status: AC
  Filled 2014-01-03: qty 20

## 2014-01-03 MED ORDER — FENTANYL CITRATE 0.05 MG/ML IJ SOLN
INTRAMUSCULAR | Status: DC | PRN
  Administered 2014-01-03: 100 ug via INTRAVENOUS
  Administered 2014-01-03: 50 ug via INTRAVENOUS

## 2014-01-03 MED ORDER — HYDROCORTISONE 2.5 % EX OINT
TOPICAL_OINTMENT | Freq: Two times a day (BID) | CUTANEOUS | Status: DC
Start: 2014-01-03 — End: 2017-07-03

## 2014-01-03 MED ORDER — DEXAMETHASONE SODIUM PHOSPHATE 10 MG/ML IJ SOLN
INTRAMUSCULAR | Status: DC | PRN
  Administered 2014-01-03: 5 mg via INTRAVENOUS

## 2014-01-03 MED ORDER — FENTANYL CITRATE 0.05 MG/ML IJ SOLN
INTRAMUSCULAR | Status: AC
  Filled 2014-01-03: qty 5

## 2014-01-03 MED ORDER — KETOROLAC TROMETHAMINE 30 MG/ML IJ SOLN: 15.0000 mg | Freq: Once | INTRAMUSCULAR | Status: DC | PRN

## 2014-01-03 MED ORDER — LACTATED RINGERS IV SOLN
INTRAVENOUS | Status: DC
  Administered 2014-01-03 (×2): via INTRAVENOUS

## 2014-01-03 MED ORDER — ONDANSETRON HCL 4 MG/2ML IJ SOLN
INTRAMUSCULAR | Status: AC
  Filled 2014-01-03: qty 2

## 2014-01-03 MED ORDER — LIDOCAINE HCL 2 % IJ SOLN
INTRAMUSCULAR | Status: DC | PRN
  Administered 2014-01-03: 20 mL

## 2014-01-03 MED ORDER — PROPOFOL 10 MG/ML IV EMUL
INTRAVENOUS | Status: AC
  Filled 2014-01-03: qty 20

## 2014-01-03 MED ORDER — MIDAZOLAM HCL 2 MG/2ML IJ SOLN
INTRAMUSCULAR | Status: DC | PRN
  Administered 2014-01-03: 2 mg via INTRAVENOUS

## 2014-01-03 SURGICAL SUPPLY — 21 items
CATH ROBINSON RED A/P 16FR (CATHETERS) ×3 IMPLANT
CLOTH BEACON ORANGE TIMEOUT ST (SAFETY) ×3 IMPLANT
DECANTER SPIKE VIAL GLASS SM (MISCELLANEOUS) ×3 IMPLANT
DILATOR CANAL MILEX (MISCELLANEOUS) IMPLANT
GLOVE BIO SURGEON STRL SZ7 (GLOVE) ×9 IMPLANT
GOWN STRL REUS W/TWL LRG LVL3 (GOWN DISPOSABLE) ×8 IMPLANT
KIT BERKELEY 1ST TRIMESTER 3/8 (MISCELLANEOUS) ×3 IMPLANT
NS IRRIG 1000ML POUR BTL (IV SOLUTION) ×3 IMPLANT
PACK VAGINAL MINOR WOMEN LF (CUSTOM PROCEDURE TRAY) ×3 IMPLANT
PAD OB MATERNITY 4.3X12.25 (PERSONAL CARE ITEMS) ×3 IMPLANT
PAD PREP 24X48 CUFFED NSTRL (MISCELLANEOUS) ×3 IMPLANT
SET BERKELEY SUCTION TUBING (SUCTIONS) ×1 IMPLANT
SUT VIC AB 0 CT1 27 (SUTURE) ×3
SUT VIC AB 0 CT1 27XBRD ANBCTR (SUTURE) IMPLANT
TOWEL OR 17X24 6PK STRL BLUE (TOWEL DISPOSABLE) ×6 IMPLANT
TUBE VACURETTE 2ND TRIMESTER (CANNULA) ×2 IMPLANT
VACURETTE 10 RIGID CVD (CANNULA) IMPLANT
VACURETTE 14MM CVD 1/2 BASE (CANNULA) ×2 IMPLANT
VACURETTE 7MM CVD STRL WRAP (CANNULA) IMPLANT
VACURETTE 8 RIGID CVD (CANNULA) IMPLANT
VACURETTE 9 RIGID CVD (CANNULA) IMPLANT

## 2014-01-03 NOTE — Discharge Instructions (Signed)
DISCHARGE INSTRUCTIONS: D&E The following instructions have been prepared to help you care for yourself upon your return home.   **You may begin taking Ibuprofen/Aleve/Motrin after 7:09 pm today**  Personal hygiene:  Use sanitary pads for vaginal drainage, not tampons.  Shower the day after your procedure.  NO tub baths, pools or Jacuzzis for 2-3 weeks.  Wipe front to back after using the bathroom.  Activity and limitations:  Do NOT drive or operate any equipment for 24 hours. The effects of anesthesia are still present and drowsiness may result.  Do NOT rest in bed all day.  Walking is encouraged.  Walk up and down stairs slowly.  You may resume your normal activity in one to two days or as indicated by your physician.  Sexual activity: NO intercourse until you have a follow-up appointment with your physician.  Diet: Eat a light meal as desired this evening. You may resume your usual diet tomorrow.  Return to work: You may resume your work activities in one to two days or as indicated by your doctor.  What to expect after your surgery: Expect to have vaginal bleeding/discharge for 2-3 days and spotting for up to 10 days. It is not unusual to have soreness for up to 1-2 weeks. You may have a slight burning sensation when you urinate for the first day. Mild cramps may continue for a couple of days. You may have a regular period in 2-6 weeks.  Call your doctor for any of the following:  Excessive vaginal bleeding, saturating and changing one pad every hour.  Inability to urinate 6 hours after discharge from hospital.  Pain not relieved by pain medication.  Fever of 100.4 F or greater.  Unusual vaginal discharge or odor.    Patients signature: ______________________  Nurses signature ________________________  Support person's signature_______________________

## 2014-01-03 NOTE — H&P (Signed)
Heather Knight is an 33 y.o. female presents for Suction D&E  33 yo G3P2002 @ 13+4 presents for suction D&E for pregnancy termination for a chromosomally abnormal fetus. The pt was seen for her first prenatal visit 2 weeks ago. US showed a large cystic hygroma, ascites, and an abdominal wall defect. NIPT was high risk for T13 > 99%. Pt saw MFM and genetic counselor. In light of results and ultrasound findings they have elected to proceed with pregnancy termination. R/B/A of then procedure were reviewed. Heather Knight was obtained on 12/28/2013  Patient's last menstrual period was 09/30/2013.    Past Medical History  Diagnosis Date  . Renal disorder     No past surgical history on file.  No family history on file.  Social History:  reports that she has never smoked. She does not have any smokeless tobacco history on file. She reports that she does not drink alcohol or use illicit drugs.  Allergies: No Known Allergies  No prescriptions prior to admission    ROS: As above  Last menstrual period 09/30/2013. Physical Exam   AOX3, NAD Abd soft NT   No results found for this or any previous visit (from the past 24 hour(s)).  No results found.  Assessment/Plan: 1) Knight for Suction D&E  Heather Knight H. 01/03/2014, 8:33 AM

## 2014-01-03 NOTE — Op Note (Signed)
Pre-Operative Diagnosis: 1) 13 week intrauterine pregnancy 2) Multiple fetal anomalies 3) Fetus with Trisomy 13 Postoperative Diagnosis: same Procedure: Pregnancy termination via suction dilation and evacuation Surgeon: Dr. Vanessa Kick Assistant:none Operative Findings: 14 week size uterus, multiparous cervix Specimen: products of conception EBL: Total I/O In: 1300 [I.V.:1300] Out: 350 [Urine:300; Blood:50]   Heather Knight Is a 33 year old gravida 3 para 2002 who presents for definitive surgical management for pregnancy termination due to the fetus having multiple fetal anomalies and trisomy 9. Please see the patient's history and physical for complete details of the history. Management options were discussed with the patient. R/B/A reviewed. Following appropriate informed consent was taken to the operating room. The patient was appropriately identified during a time out procedure. MAC anesthesia was administered and the patient was placed in the dorsal lithotomy position. The patient was prepped and draped in the normal sterile fashion. A speculum was placed into the vagina, a single-tooth tenaculum was placed on the anterior lip of the cervix, and 20 cc of 2% lidocaine was administered in a paracervical fashion. The cervix was serially dilated with Kennon Rounds dilators.  A # 14 suction curet was then passed to the fundus, the vacuum was engaged, and 3 suction passes were performed with a curette. A Sharp curettage was performed and a gritty texture was noted. A bedside ultrasound was performed transvaginally to confirm complete evacuation of the uterus. A final suction pass was performed with minimal results. This completed the procedure. The patient tolerated the procedure well was brought to the recovery room in stable condition for the procedure. All sponge and needle counts correct x2.

## 2014-01-03 NOTE — Anesthesia Postprocedure Evaluation (Signed)
  Anesthesia Post-op Note  Anesthesia Post Note  Patient: Heather Knight  Procedure(s) Performed: Procedure(s) (LRB): DILATATION AND EVACUATION (N/A)  Anesthesia type: MAC  Patient location: PACU  Post pain: Pain level controlled  Post assessment: Post-op Vital signs reviewed  Last Vitals:  Filed Vitals:   01/03/14 1345  BP: 101/60  Pulse: 74  Temp:   Resp: 21    Post vital signs: Reviewed  Level of consciousness: sedated  Complications: No apparent anesthesia complications

## 2014-01-03 NOTE — Transfer of Care (Signed)
Immediate Anesthesia Transfer of Care Note  Patient: Heather Knight  Procedure(s) Performed: Procedure(s): DILATATION AND EVACUATION (N/A)  Patient Location: PACU  Anesthesia Type:MAC  Level of Consciousness: awake, alert  and oriented  Airway & Oxygen Therapy: Patient Spontanous Breathing  Post-op Assessment: Report given to PACU RN and Post -op Vital signs reviewed and stable  Post vital signs: Reviewed and stable  Complications: No apparent anesthesia complications

## 2014-01-03 NOTE — Anesthesia Preprocedure Evaluation (Addendum)
Anesthesia Evaluation  Patient identified by MRN, date of birth, ID band Patient awake    Reviewed: Allergy & Precautions, H&P , NPO status , Patient's Chart, lab work & pertinent test results, reviewed documented beta blocker date and time   History of Anesthesia Complications Negative for: history of anesthetic complications  Airway Mallampati: II  TM Distance: >3 FB Neck ROM: full    Dental  (+) Teeth Intact   Pulmonary neg pulmonary ROS,  breath sounds clear to auscultation        Cardiovascular negative cardio ROS  Rhythm:regular Rate:Normal     Neuro/Psych negative neurological ROS  negative psych ROS   GI/Hepatic negative GI ROS, Neg liver ROS,   Endo/Other  negative endocrine ROS  Renal/GU Renal disease (h/o kidney stones)  negative genitourinary   Musculoskeletal   Abdominal   Peds  Hematology negative hematology ROS (+)   Anesthesia Other Findings   Reproductive/Obstetrics (+) Pregnancy (trisomy 13 at 14 weeks, for TAB)                            Anesthesia Physical Anesthesia Plan  ASA: II  Anesthesia Plan: MAC   Post-op Pain Management:    Induction:   Airway Management Planned:   Additional Equipment:   Intra-op Plan:   Post-operative Plan:   Informed Consent: I have reviewed the patients History and Physical, chart, labs and discussed the procedure including the risks, benefits and alternatives for the proposed anesthesia with the patient or authorized representative who has indicated his/her understanding and acceptance.     Plan Discussed with: Surgeon and CRNA  Anesthesia Plan Comments:         Anesthesia Quick Evaluation

## 2014-01-04 ENCOUNTER — Encounter (HOSPITAL_COMMUNITY): Payer: Self-pay | Admitting: Obstetrics and Gynecology

## 2014-03-25 ENCOUNTER — Encounter (HOSPITAL_COMMUNITY): Payer: Self-pay

## 2014-10-22 ENCOUNTER — Encounter (HOSPITAL_COMMUNITY): Payer: Self-pay | Admitting: *Deleted

## 2015-01-16 ENCOUNTER — Ambulatory Visit (INDEPENDENT_AMBULATORY_CARE_PROVIDER_SITE_OTHER): Payer: 59 | Admitting: Family Medicine

## 2015-01-16 ENCOUNTER — Inpatient Hospital Stay (HOSPITAL_COMMUNITY)
Admission: EM | Admit: 2015-01-16 | Discharge: 2015-01-18 | DRG: 343 | Disposition: A | Discharge status: Home / Self Care | Payer: 59 | Attending: General Surgery | Admitting: General Surgery

## 2015-01-16 ENCOUNTER — Encounter (HOSPITAL_COMMUNITY): Payer: Self-pay | Admitting: Emergency Medicine

## 2015-01-16 ENCOUNTER — Ambulatory Visit (INDEPENDENT_AMBULATORY_CARE_PROVIDER_SITE_OTHER): Payer: 59

## 2015-01-16 VITALS — BP 120/80 | HR 117 | Temp 98.9°F | Resp 18 | Ht 63.0 in | Wt 160.0 lb

## 2015-01-16 DIAGNOSIS — Z23 Encounter for immunization: Secondary | ICD-10-CM

## 2015-01-16 DIAGNOSIS — K3580 Unspecified acute appendicitis: Secondary | ICD-10-CM | POA: Diagnosis not present

## 2015-01-16 DIAGNOSIS — R1030 Lower abdominal pain, unspecified: Secondary | ICD-10-CM

## 2015-01-16 DIAGNOSIS — R1013 Epigastric pain: Secondary | ICD-10-CM

## 2015-01-16 DIAGNOSIS — R100 Acute abdomen: Secondary | ICD-10-CM

## 2015-01-16 DIAGNOSIS — R51 Headache: Secondary | ICD-10-CM | POA: Diagnosis not present

## 2015-01-16 DIAGNOSIS — R112 Nausea with vomiting, unspecified: Secondary | ICD-10-CM

## 2015-01-16 DIAGNOSIS — K219 Gastro-esophageal reflux disease without esophagitis: Secondary | ICD-10-CM | POA: Diagnosis present

## 2015-01-16 HISTORY — DX: Gastro-esophageal reflux disease without esophagitis: K21.9

## 2015-01-16 HISTORY — DX: Calculus of kidney: N20.0

## 2015-01-16 LAB — POCT URINALYSIS DIP (MANUAL ENTRY)
Bilirubin, UA: NEGATIVE
Glucose, UA: NEGATIVE
Leukocytes, UA: NEGATIVE
Nitrite, UA: NEGATIVE
PH UA: 6
Protein Ur, POC: NEGATIVE
Spec Grav, UA: 1.02
UROBILINOGEN UA: 1

## 2015-01-16 LAB — POCT CBC
GRANULOCYTE PERCENT: 86.7 % — AB (ref 37–80)
HEMATOCRIT: 45 % (ref 37.7–47.9)
HEMOGLOBIN: 15.6 g/dL (ref 12.2–16.2)
Lymph, poc: 1.8 (ref 0.6–3.4)
MCH, POC: 30.9 pg (ref 27–31.2)
MCHC: 34.8 g/dL (ref 31.8–35.4)
MCV: 89 fL (ref 80–97)
MID (cbc): 1.2 — AB (ref 0–0.9)
MPV: 9 fL (ref 0–99.8)
PLATELET COUNT, POC: 231 10*3/uL (ref 142–424)
POC Granulocyte: 19.9 — AB (ref 2–6.9)
POC LYMPH PERCENT: 8 %L — AB (ref 10–50)
POC MID %: 5.3 %M (ref 0–12)
RBC: 5.06 M/uL (ref 4.04–5.48)
RDW, POC: 12.4 %
WBC: 22.9 10*3/uL — AB (ref 4.6–10.2)

## 2015-01-16 LAB — POC MICROSCOPIC URINALYSIS (UMFC)

## 2015-01-16 LAB — URINALYSIS, ROUTINE W REFLEX MICROSCOPIC
BILIRUBIN URINE: NEGATIVE
Glucose, UA: NEGATIVE mg/dL
Leukocytes, UA: NEGATIVE
NITRITE: NEGATIVE
Protein, ur: NEGATIVE mg/dL
Specific Gravity, Urine: 1.017 (ref 1.005–1.030)
pH: 6 (ref 5.0–8.0)

## 2015-01-16 LAB — URINE MICROSCOPIC-ADD ON

## 2015-01-16 LAB — I-STAT CG4 LACTIC ACID, ED: Lactic Acid, Venous: 1.02 mmol/L (ref 0.5–2.0)

## 2015-01-16 LAB — POC URINE PREG, ED: PREG TEST UR: NEGATIVE

## 2015-01-16 LAB — LIPASE, BLOOD: Lipase: 17 U/L (ref 11–51)

## 2015-01-16 MED ORDER — ONDANSETRON HCL 4 MG/2ML IJ SOLN
4.0000 mg | Freq: Once | INTRAMUSCULAR | Status: AC | PRN
  Administered 2015-01-16: 4 mg via INTRAVENOUS
  Filled 2015-01-16: qty 2

## 2015-01-16 MED ORDER — ONDANSETRON HCL 4 MG/2ML IJ SOLN
4.0000 mg | Freq: Once | INTRAMUSCULAR | Status: AC
  Administered 2015-01-16: 4 mg via INTRAVENOUS
  Filled 2015-01-16: qty 2

## 2015-01-16 MED ORDER — FENTANYL CITRATE (PF) 100 MCG/2ML IJ SOLN
50.0000 ug | Freq: Once | INTRAMUSCULAR | Status: AC
  Administered 2015-01-16: 50 ug via INTRAVENOUS
  Filled 2015-01-16: qty 2

## 2015-01-16 MED ORDER — MORPHINE SULFATE (PF) 4 MG/ML IV SOLN
4.0000 mg | Freq: Once | INTRAVENOUS | Status: AC
  Administered 2015-01-16: 4 mg via INTRAVENOUS
  Filled 2015-01-16: qty 1

## 2015-01-16 NOTE — Patient Instructions (Signed)
Go to [facility] emergency room.  Tell the person at the check-in desk that I spoke to the charge nurse and would like to have them try and get you back as priority for evaluation and treatment.  You need evaluation, probably including a lipase, CMP, CT scan of the abdomen, and IV fluids.

## 2015-01-16 NOTE — ED Notes (Signed)
Dr. Jori Moll at the bedside.

## 2015-01-16 NOTE — ED Provider Notes (Signed)
CSN: [id number]     Arrival date & time 01/16/15  1952 History   First MD Initiated Contact with Patient 01/16/15 2152     Chief Complaint  Patient presents with  . Abdominal Pain     (Consider location/radiation/quality/duration/timing/severity/associated sxs/prior Treatment) HPI  Patient is a 34 year old female past medical history significant for history of kidney stones, who presents to the emergency department with a 2 day history of abdominal pain. Sudden onset of lower abdominal pain that has been constant, sharp, progressively worsening since that time. Associated with nausea, emesis. Denies fevers, chills, diarrhea, blood in the stool, melena, dysuria, hematuria. States this does not feel like her prior kidney stones. Denies pelvic pain or vaginal discharge. No history of STD. No new sexual partners. Decreased by mouth intake. Seen by her PCP earlier today, found to have a leukocytosis of 21. X-ray of the abdomen showed no acute findings.  Past Medical History  Diagnosis Date  . Renal disorder   . Kidney stones   . GERD (gastroesophageal reflux disease)    Past Surgical History  Procedure Laterality Date  . Dilation and evacuation N/A 01/03/2014    Procedure: DILATATION AND EVACUATION;  Surgeon: Farrel Gobble. Harrington Challenger, MD;  Location: [facility];  Service: Gynecology;  Laterality: N/A;   History reviewed. No pertinent family history. Social History  Substance Use Topics  . Smoking status: Never Smoker   . Smokeless tobacco: None  . Alcohol Use: No   OB History    Gravida Para Term Preterm AB TAB SAB Ectopic Multiple Living   2 1 1  0 0 0 0 0 0 1     Review of Systems  Constitutional: Negative for fever, activity change and appetite change.  HENT: Negative for congestion, facial swelling and trouble swallowing.   Eyes: Negative for visual disturbance.  Respiratory: Negative for cough, chest tightness and shortness of breath.   Cardiovascular: Negative for chest pain.   Gastrointestinal: Positive for nausea, vomiting and abdominal pain. Negative for diarrhea, constipation, blood in stool and abdominal distention.  Genitourinary: Negative for dysuria, urgency, frequency, hematuria, flank pain, vaginal discharge and menstrual problem.  Musculoskeletal: Negative for back pain and neck pain.  Skin: Negative for rash and wound.  Neurological: Negative for dizziness, seizures, weakness and light-headedness.  Psychiatric/Behavioral: Negative for behavioral problems.      Allergies  Review of patient's allergies indicates no known allergies.  Home Medications   Prior to Admission medications   Medication Sig Start Date End Date Taking? Authorizing Provider  DAYSEE 0.15-0.03 &0.01 MG tablet Take 1 tablet by mouth daily. 01/12/15  Yes Historical Provider, MD  HYDROcodone-acetaminophen (NORCO/VICODIN) 5-325 MG tablet Take 1 tablet by mouth every 6 (six) hours as needed for moderate pain.   Yes Historical Provider, MD  omeprazole (PRILOSEC) 20 MG capsule Take 1 capsule (20 mg total) by mouth daily. Patient taking differently: Take 20 mg by mouth daily as needed (acid reflux).  08/16/12  Yes Heather Laisure, PA-C  ondansetron (ZOFRAN-ODT) 8 MG disintegrating tablet Take 8 mg by mouth every 8 (eight) hours as needed for nausea or vomiting.   Yes Historical Provider, MD  promethazine (PHENERGAN) 25 MG tablet Take 25 mg by mouth every 6 (six) hours as needed for nausea or vomiting.   Yes Historical Provider, MD  hydrocortisone 2.5 % ointment Apply topically 2 (two) times daily. Patient not taking: Reported on 01/16/2015 01/03/14   Vanessa Kick, MD  oxyCODONE-acetaminophen (ROXICET) 5-325 MG per tablet Take 2 tablets by  mouth every 4 (four) hours as needed. May take 1-2 tablets every 4-6 hours as needed for pain Patient not taking: Reported on 01/16/2015 01/03/14   Vanessa Kick, MD   BP 109/76 mmHg  Pulse 84  Temp(Src) 98.8 F (37.1 C) (Oral)  Resp 20  Ht 5\' 1"  (1.549  m)  Wt 72.576 kg  BMI 30.25 kg/m2  SpO2 97%  LMP 01/10/2015 Physical Exam  Constitutional: She is oriented to person, place, and time. She appears well-developed and well-nourished.  HENT:  Head: Atraumatic.  Mouth/Throat: Oropharynx is clear and moist.  Eyes: Conjunctivae and EOM are normal. Pupils are equal, round, and reactive to light.  Neck: Normal range of motion. Neck supple.  Cardiovascular: Normal rate, regular rhythm, normal heart sounds and intact distal pulses.   Pulmonary/Chest: Effort normal and breath sounds normal. No respiratory distress. She has no wheezes. She exhibits no tenderness.  Abdominal: Soft. Bowel sounds are normal. She exhibits no distension and no ascites. There is no hepatosplenomegaly. There is tenderness in the right lower quadrant, suprapubic area and left lower quadrant. There is tenderness at McBurney's point (positive Rovsing, negative psoas). There is no rigidity, no rebound, no guarding, no CVA tenderness and negative Murphy's sign.  Genitourinary: Vagina normal and uterus normal. There is no rash on the right labia. There is no rash on the left labia. Cervix exhibits no motion tenderness, no discharge and no friability. Right adnexum displays no mass, no tenderness and no fullness. Left adnexum displays no mass, no tenderness and no fullness. No erythema or tenderness in the vagina. No vaginal discharge found.  Musculoskeletal: Normal range of motion.  Neurological: She is alert and oriented to person, place, and time.  Skin: Skin is warm. No pallor.  Psychiatric: She has a normal mood and affect.    ED Course  Procedures (including critical care time) Labs Review Labs Reviewed  URINALYSIS, ROUTINE W REFLEX MICROSCOPIC (NOT AT [facility]) - Abnormal; Notable for the following:    Color, Urine AMBER (*)    APPearance CLOUDY (*)    Hgb urine dipstick MODERATE (*)    Ketones, ur >80 (*)    All other components within normal limits  URINE MICROSCOPIC-ADD  ON - Abnormal; Notable for the following:    Squamous Epithelial / LPF 0-5 (*)    Bacteria, UA FEW (*)    All other components within normal limits  I-STAT CHEM 8, ED - Abnormal; Notable for the following:    Chloride 100 (*)    Hemoglobin 16.0 (*)    HCT 47.0 (*)    All other components within normal limits  LIPASE, BLOOD  RPR  HIV ANTIBODY (ROUTINE TESTING)  POC URINE PREG, ED  I-STAT CG4 LACTIC ACID, ED  GC/CHLAMYDIA PROBE AMP ([place]) NOT AT [facility]    Imaging Review Ct Abdomen Pelvis W Contrast  01/17/2015  CLINICAL DATA:  Right-sided abdominal pain radiating to the lower pelvis. Nausea and vomiting today. Right lower quadrant pain. EXAM: CT ABDOMEN AND PELVIS WITH CONTRAST TECHNIQUE: Multidetector CT imaging of the abdomen and pelvis was performed using the standard protocol following bolus administration of intravenous contrast. CONTRAST:  146mL OMNIPAQUE IOHEXOL 300 MG/ML  SOLN COMPARISON:  None. FINDINGS: Lung bases are clear. Circumscribed low-attenuation lesions in the left lobe of the liver measuring 15 mm diameter and in the right lobe of the liver measuring 15 mm diameter. These likely represent small cysts. No other focal lesions identified. The gallbladder, spleen, pancreas, adrenal glands, abdominal  aorta, inferior vena cava, and retroperitoneal lymph nodes are unremarkable. Prominent renal pelvis bilaterally but greater on the right. No obstructing stone or hydroureter. There is an nonobstructing stones seen in the mid right kidney measuring 2 mm diameter. Stomach is decompressed. Small bowel are decompressed. Colon is not abnormally distended and is filled with scattered stool. No free air or free fluid in the abdomen. Abdominal wall musculature appears intact. Pelvis: The appendix is markedly distended and thick walled with diameter of about 2 cm. There is infiltration in the periappendiceal fat with small amount of fluid in the right lower quadrant and pelvis. Changes are  consistent with acute appendicitis. No abscess. Reactive thickening of the sigmoid colon wall. Scattered diverticula in the sigmoid colon. Uterus and ovaries are not enlarged. Bladder wall is not thickened. No pelvic mass or lymphadenopathy. No destructive bone lesions. IMPRESSION: Distended and thick-walled appendix with prominent stranding in the periappendiceal and pelvic fat. Fluid in the right lower quadrant and pelvis. Changes consistent with acute appendicitis. No focal abscess. Electronically Signed   By: Lucienne Capers M.D.   On: 01/17/2015 01:53   Dg Abd 2 Views  01/16/2015  CLINICAL DATA:  34 year old female with a history of abdominal pain and vomiting. Pain localizes to lower abdomen and mid epigastric region. EXAM: ABDOMEN - 2 VIEW COMPARISON:  Prior abdominal radiographs 04/14/2009 FINDINGS: The bowel gas pattern is normal. There is no evidence of free air. No radio-opaque calculi or other significant radiographic abnormality is seen. IMPRESSION: Negative. Electronically Signed   By: Jacqulynn Cadet M.D.   On: 01/16/2015 19:50   I have personally reviewed and evaluated these images and lab results as part of my medical decision-making.   EKG Interpretation None      MDM   Final diagnoses:  None    Patient is a 34 year old female with no significant past medical history who presents to the emergency department with a 2 day history of lower abdominal pain associated with emesis. On arrival no acute distress, not ill appearing. Afebrile, hemodynamically stable. Exam as above, notable for RLQ TTP, positive McBurney's, positive Rovsing sign, no CVA tenderness, no signs of peritonitis. GU exam unremarkable, no adnexal fullness, no vaginal discharge, no CMT.  Differential diagnosis includes appendicitis, nephrolithiasis, pancreatitis, diverticulitis/diverticulosis, IBD, IBS. CBC obtained at PCP earlier today, remarkable for leukocytosis 22.9. XR abdomen showed no acute findings.  Given unremarkable GU exam, doubt TOA, ovarian torsion, PID, cervicitis. No new risk factors.  No electrolyte abnormalities. Lipase within normal limits. UA showed no signs of infection. We will obtain CT A/P to evaluate for emergent etiology of the patient's abdominal pain.  Given pain medication and antiemetics with improvement of her pain.  Patient's care was signed out to Dr. Betsey Holiday, currently CT A/P is pending, please see their note for further information regarding results.     Nathaniel Man, MD 01/17/15 [id number]  Orlie Dakin, MD 01/18/15 [phone]

## 2015-01-16 NOTE — ED Notes (Signed)
Verbal order for 4mg  of morphine from dr. Jori Moll for pain management.

## 2015-01-16 NOTE — ED Provider Notes (Signed)
Planes of lower abdominal pain onset 3 days ago. Symptoms come in for multiple episodes of vomiting. Last bowel movement today, normal. No urinary symptoms no vaginal discharge. On exam alert and nontoxic abdomen NABS. Nondistended. Mildly tender lower quadrants, worse at right lower quadrant. No guarding rigidity or rebound  Orlie Dakin, MD 01/16/15 [phone]

## 2015-01-16 NOTE — ED Notes (Signed)
Patient reports having abdominal pain and not being able to keep food down for 2 days. She reports her Heather Knight count was checked today at the doctors office, and and xray showed a lot of "air" in abdomen. Generalized abdominal pain, and lots of dry heaving.

## 2015-01-16 NOTE — Progress Notes (Signed)
Patient ID: Heather Knight, female    DOB: 1980/11/25  Age: 34 y.o. MRN: [id number]  Chief Complaint  Patient presents with  . Abdominal Pain    saturday night    Subjective:   34 year old lady who has a history of abdominal pain. She's been vomiting since Saturday. He is across the lower abdomen and in the epigastric region. She has a history of GERD, has not taken her Prilosec very faithfully. Her last menstrual cycle began 6 days ago. She had a bowel movement on Saturday which was normal, none since. She has not eaten or drunk much.Angus Seller of review of systems is unremarkable.  Current allergies, medications, problem list, past/family and social histories reviewed.  Objective:  BP 120/80 mmHg  Pulse 117  Temp(Src) 98.9 F (37.2 C) (Oral)  Resp 18  Ht 5\' 3"  (1.6 m)  Wt 160 lb (72.576 kg)  BMI 28.35 kg/m2  SpO2 98%  LMP 01/10/2015  Afebrile. Chest clear. Heart regular without murmur. Abdomen has now sounds present. Not tympanitic. Not distended. Soft without organomegaly or masses. Is tender in the epigastrium up high and across the low abdomen and both lower quadrants and suprapubic.  Assessment & Plan:   Assessment: 1. Epigastric pain   2. Abdominal pain, lower   3. Non-intractable vomiting with nausea, unspecified vomiting type   4. Acute abdomen       Plan: CBC, urinalysis, x-ray of abdomen  [facility] reading (PRIMARY) by  Dr. Linna Darner Extensive gas in upper abdomen.  No definite obstruction.  Results for orders placed or performed in visit on 01/16/15  POCT CBC  Result Value Ref Range   WBC 22.9 (A) 4.6 - 10.2 K/uL   Lymph, poc 1.8 0.6 - 3.4   POC LYMPH PERCENT 8.0 (A) 10 - 50 %L   MID (cbc) 1.2 (A) 0 - 0.9   POC MID % 5.3 0 - 12 %M   POC Granulocyte 19.9 (A) 2 - 6.9   Granulocyte percent 86.7 (A) 37 - 80 %G   RBC 5.06 4.04 - 5.48 M/uL   Hemoglobin 15.6 12.2 - 16.2 g/dL   HCT, POC 45.0 37.7 - 47.9 %   MCV 89.0 80 - 97 fL   MCH, POC 30.9 27 - 31.2 pg   MCHC 34.8 31.8 - 35.4 g/dL   RDW, POC 12.4 %   Platelet Count, POC 231 142 - 424 K/uL   MPV 9.0 0 - 99.8 fL  POCT urinalysis dipstick  Result Value Ref Range   Color, UA yellow yellow   Clarity, UA clear clear   Glucose, UA negative negative   Bilirubin, UA negative negative   Ketones, POC UA large (80) (A) negative   Spec Grav, UA 1.020    Blood, UA small (A) negative   pH, UA 6.0    Protein Ur, POC negative negative   Urobilinogen, UA 1.0    Nitrite, UA Negative Negative   Leukocytes, UA Negative Negative  POCT Microscopic Urinalysis ([facility])  Result Value Ref Range   WBC,UR,HPF,POC Few (A) None WBC/hpf   RBC,UR,HPF,POC Few (A) None RBC/hpf   Bacteria Few (A) None, Too numerous to count   Mucus Present (A) Absent   Epithelial Cells, UR Per Microscopy Few (A) None, Too numerous to count cells/hpf   .    Orders Placed This Encounter  Procedures  . DG Abd 2 Views    Order Specific Question:  Reason for Exam (SYMPTOM  OR DIAGNOSIS REQUIRED)  Answer:  epigastric and low abdominal pain    Order Specific Question:  Is the patient pregnant?    Answer:  No    Order Specific Question:  Preferred imaging location?    Answer:  External  . POCT CBC  . POCT urinalysis dipstick  . POCT Microscopic Urinalysis ([facility])        Patient Instructions  Go to [facility] emergency room.  Tell the person at the check-in desk that I spoke to the charge nurse and would like to have them try and get you back as priority for evaluation and treatment.  You need evaluation, probably including a lipase, CMP, CT scan of the abdomen, and IV fluids.     Return if symptoms worsen or fail to improve.   Elroy Schembri, MD 01/16/2015

## 2015-01-16 NOTE — ED Notes (Signed)
Called CT for ETA on exam.

## 2015-01-17 ENCOUNTER — Emergency Department (HOSPITAL_COMMUNITY): Payer: 59 | Admitting: Certified Registered Nurse Anesthetist

## 2015-01-17 ENCOUNTER — Encounter (HOSPITAL_COMMUNITY): Admission: EM | Disposition: A | Discharge status: Home / Self Care | Payer: Self-pay | Source: Home / Self Care

## 2015-01-17 ENCOUNTER — Emergency Department (HOSPITAL_COMMUNITY): Payer: 59

## 2015-01-17 DIAGNOSIS — K3580 Unspecified acute appendicitis: Secondary | ICD-10-CM | POA: Diagnosis present

## 2015-01-17 DIAGNOSIS — Z23 Encounter for immunization: Secondary | ICD-10-CM | POA: Diagnosis not present

## 2015-01-17 DIAGNOSIS — K219 Gastro-esophageal reflux disease without esophagitis: Secondary | ICD-10-CM | POA: Diagnosis present

## 2015-01-17 DIAGNOSIS — R51 Headache: Secondary | ICD-10-CM | POA: Diagnosis not present

## 2015-01-17 HISTORY — PX: LAPAROSCOPIC APPENDECTOMY: SHX408

## 2015-01-17 LAB — I-STAT CHEM 8, ED
BUN: 6 mg/dL (ref 6–20)
CREATININE: 0.7 mg/dL (ref 0.44–1.00)
Calcium, Ion: 1.12 mmol/L (ref 1.12–1.23)
Chloride: 100 mmol/L — ABNORMAL LOW (ref 101–111)
Glucose, Bld: 65 mg/dL (ref 65–99)
HEMATOCRIT: 47 % — AB (ref 36.0–46.0)
Hemoglobin: 16 g/dL — ABNORMAL HIGH (ref 12.0–15.0)
POTASSIUM: 3.7 mmol/L (ref 3.5–5.1)
Sodium: 138 mmol/L (ref 135–145)
TCO2: 24 mmol/L (ref 0–100)

## 2015-01-17 LAB — CBC
HEMATOCRIT: 36.1 % (ref 36.0–46.0)
Hemoglobin: 12.4 g/dL (ref 12.0–15.0)
MCH: 31.2 pg (ref 26.0–34.0)
MCHC: 34.3 g/dL (ref 30.0–36.0)
MCV: 90.7 fL (ref 78.0–100.0)
PLATELETS: 189 10*3/uL (ref 150–400)
RBC: 3.98 MIL/uL (ref 3.87–5.11)
RDW: 12.5 % (ref 11.5–15.5)
WBC: 14.5 10*3/uL — AB (ref 4.0–10.5)

## 2015-01-17 LAB — HIV ANTIBODY (ROUTINE TESTING W REFLEX): HIV Screen 4th Generation wRfx: NONREACTIVE

## 2015-01-17 LAB — RPR: RPR Ser Ql: NONREACTIVE

## 2015-01-17 LAB — CREATININE, SERUM: Creatinine, Ser: 0.76 mg/dL (ref 0.44–1.00)

## 2015-01-17 SURGERY — APPENDECTOMY, LAPAROSCOPIC
Anesthesia: General | Site: Abdomen

## 2015-01-17 MED ORDER — PIPERACILLIN-TAZOBACTAM 3.375 G IVPB 30 MIN
3.3750 g | Freq: Once | INTRAVENOUS | Status: AC
  Administered 2015-01-17: 3.375 g via INTRAVENOUS
  Filled 2015-01-17: qty 50

## 2015-01-17 MED ORDER — MIDAZOLAM HCL 2 MG/2ML IJ SOLN
INTRAMUSCULAR | Status: AC
  Filled 2015-01-17: qty 2

## 2015-01-17 MED ORDER — ARTIFICIAL TEARS OP OINT
TOPICAL_OINTMENT | OPHTHALMIC | Status: AC
  Filled 2015-01-17: qty 3.5

## 2015-01-17 MED ORDER — ACETAMINOPHEN 325 MG PO TABS
650.0000 mg | ORAL_TABLET | Freq: Four times a day (QID) | ORAL | Status: DC | PRN
  Administered 2015-01-17: 650 mg via ORAL
  Filled 2015-01-17 (×2): qty 2

## 2015-01-17 MED ORDER — PROPOFOL 10 MG/ML IV BOLUS
INTRAVENOUS | Status: DC | PRN
  Administered 2015-01-17: 150 mg via INTRAVENOUS

## 2015-01-17 MED ORDER — IOHEXOL 300 MG/ML  SOLN
100.0000 mL | Freq: Once | INTRAMUSCULAR | Status: AC | PRN
  Administered 2015-01-17: 100 mL via INTRAVENOUS

## 2015-01-17 MED ORDER — LACTATED RINGERS IV SOLN
INTRAVENOUS | Status: DC
  Administered 2015-01-17 (×2): via INTRAVENOUS

## 2015-01-17 MED ORDER — FENTANYL CITRATE (PF) 100 MCG/2ML IJ SOLN
INTRAMUSCULAR | Status: DC | PRN
  Administered 2015-01-17 (×7): 50 ug via INTRAVENOUS

## 2015-01-17 MED ORDER — SUGAMMADEX SODIUM 200 MG/2ML IV SOLN
INTRAVENOUS | Status: AC
  Filled 2015-01-17: qty 2

## 2015-01-17 MED ORDER — SODIUM CHLORIDE 0.9 % IJ SOLN
INTRAMUSCULAR | Status: AC
  Filled 2015-01-17: qty 10

## 2015-01-17 MED ORDER — KETOROLAC TROMETHAMINE 30 MG/ML IJ SOLN
INTRAMUSCULAR | Status: AC
  Filled 2015-01-17: qty 1

## 2015-01-17 MED ORDER — ROCURONIUM BROMIDE 100 MG/10ML IV SOLN
INTRAVENOUS | Status: DC | PRN
  Administered 2015-01-17: 5 mg via INTRAVENOUS
  Administered 2015-01-17: 10 mg via INTRAVENOUS
  Administered 2015-01-17: 20 mg via INTRAVENOUS

## 2015-01-17 MED ORDER — MIDAZOLAM HCL 5 MG/5ML IJ SOLN
INTRAMUSCULAR | Status: DC | PRN
  Administered 2015-01-17: 2 mg via INTRAVENOUS

## 2015-01-17 MED ORDER — PROPOFOL 10 MG/ML IV BOLUS
INTRAVENOUS | Status: AC
  Filled 2015-01-17: qty 20

## 2015-01-17 MED ORDER — ONDANSETRON HCL 4 MG/2ML IJ SOLN
INTRAMUSCULAR | Status: DC | PRN
  Administered 2015-01-17: 4 mg via INTRAVENOUS

## 2015-01-17 MED ORDER — ACETAMINOPHEN 650 MG RE SUPP
650.0000 mg | Freq: Four times a day (QID) | RECTAL | Status: DC | PRN
  Filled 2015-01-17: qty 1

## 2015-01-17 MED ORDER — GLYCOPYRROLATE 0.2 MG/ML IJ SOLN
INTRAMUSCULAR | Status: AC
  Filled 2015-01-17: qty 3

## 2015-01-17 MED ORDER — LACTATED RINGERS IV SOLN
INTRAVENOUS | Status: DC | PRN
  Administered 2015-01-17 (×2): via INTRAVENOUS

## 2015-01-17 MED ORDER — ONDANSETRON 4 MG PO TBDP
4.0000 mg | ORAL_TABLET | Freq: Four times a day (QID) | ORAL | Status: DC | PRN
  Filled 2015-01-17: qty 1

## 2015-01-17 MED ORDER — HYDROMORPHONE HCL 1 MG/ML IJ SOLN
INTRAMUSCULAR | Status: AC
  Filled 2015-01-17: qty 1

## 2015-01-17 MED ORDER — NEOSTIGMINE METHYLSULFATE 10 MG/10ML IV SOLN
INTRAVENOUS | Status: AC
  Filled 2015-01-17: qty 4

## 2015-01-17 MED ORDER — KETOROLAC TROMETHAMINE 30 MG/ML IJ SOLN
30.0000 mg | Freq: Once | INTRAMUSCULAR | Status: AC
  Administered 2015-01-17: 30 mg via INTRAVENOUS

## 2015-01-17 MED ORDER — FENTANYL CITRATE (PF) 250 MCG/5ML IJ SOLN
INTRAMUSCULAR | Status: AC
  Filled 2015-01-17: qty 5

## 2015-01-17 MED ORDER — INFLUENZA VAC SPLIT QUAD 0.5 ML IM SUSY
0.5000 mL | PREFILLED_SYRINGE | INTRAMUSCULAR | Status: AC
  Administered 2015-01-18: 0.5 mL via INTRAMUSCULAR

## 2015-01-17 MED ORDER — LIDOCAINE HCL (CARDIAC) 20 MG/ML IV SOLN
INTRAVENOUS | Status: AC
  Filled 2015-01-17: qty 5

## 2015-01-17 MED ORDER — MORPHINE SULFATE (PF) 2 MG/ML IV SOLN
1.0000 mg | INTRAVENOUS | Status: DC | PRN
  Administered 2015-01-17 (×2): 2 mg via INTRAVENOUS
  Filled 2015-01-17 (×2): qty 1

## 2015-01-17 MED ORDER — BUPIVACAINE HCL 0.25 % IJ SOLN
INTRAMUSCULAR | Status: DC | PRN
  Administered 2015-01-17: 3 mL

## 2015-01-17 MED ORDER — ENOXAPARIN SODIUM 40 MG/0.4ML ~~LOC~~ SOLN
40.0000 mg | SUBCUTANEOUS | Status: DC
  Administered 2015-01-17: 40 mg via SUBCUTANEOUS
  Filled 2015-01-17 (×2): qty 0.4

## 2015-01-17 MED ORDER — SUCCINYLCHOLINE CHLORIDE 20 MG/ML IJ SOLN
INTRAMUSCULAR | Status: DC | PRN
  Administered 2015-01-17: 100 mg via INTRAVENOUS

## 2015-01-17 MED ORDER — SUGAMMADEX SODIUM 200 MG/2ML IV SOLN
INTRAVENOUS | Status: DC | PRN
  Administered 2015-01-17: 150 mg via INTRAVENOUS

## 2015-01-17 MED ORDER — HYDROMORPHONE HCL 1 MG/ML IJ SOLN
0.2500 mg | INTRAMUSCULAR | Status: DC | PRN
  Administered 2015-01-17 (×4): 0.5 mg via INTRAVENOUS

## 2015-01-17 MED ORDER — HYDROCODONE-ACETAMINOPHEN 7.5-325 MG PO TABS: 1.0000 | ORAL_TABLET | Freq: Once | ORAL | Status: DC | PRN

## 2015-01-17 MED ORDER — OXYCODONE-ACETAMINOPHEN 5-325 MG PO TABS
1.0000 | ORAL_TABLET | ORAL | Status: DC | PRN
  Administered 2015-01-17 – 2015-01-18 (×2): 2 via ORAL
  Filled 2015-01-17 (×2): qty 2

## 2015-01-17 MED ORDER — LIDOCAINE HCL (CARDIAC) 20 MG/ML IV SOLN
INTRAVENOUS | Status: DC | PRN
  Administered 2015-01-17: 60 mg via INTRAVENOUS

## 2015-01-17 MED ORDER — BUPIVACAINE-EPINEPHRINE (PF) 0.25% -1:200000 IJ SOLN
INTRAMUSCULAR | Status: AC
  Filled 2015-01-17: qty 30

## 2015-01-17 MED ORDER — ONDANSETRON HCL 4 MG/2ML IJ SOLN
4.0000 mg | Freq: Four times a day (QID) | INTRAMUSCULAR | Status: DC | PRN
  Administered 2015-01-17 – 2015-01-18 (×3): 4 mg via INTRAVENOUS
  Filled 2015-01-17 (×4): qty 2

## 2015-01-17 MED ORDER — PROMETHAZINE HCL 25 MG/ML IJ SOLN: 6.2500 mg | INTRAMUSCULAR | Status: DC | PRN

## 2015-01-17 SURGICAL SUPPLY — 47 items
APL SKNCLS STERI-STRIP NONHPOA (GAUZE/BANDAGES/DRESSINGS) ×1
APPLIER CLIP 5 13 M/L LIGAMAX5 (MISCELLANEOUS)
APR CLP MED LRG 5 ANG JAW (MISCELLANEOUS)
BAG SPEC RTRVL 10 TROC 200 (ENDOMECHANICALS) ×1
BENZOIN TINCTURE PRP APPL 2/3 (GAUZE/BANDAGES/DRESSINGS) ×3 IMPLANT
BLADE SURG ROTATE 9660 (MISCELLANEOUS) IMPLANT
CANISTER SUCTION 2500CC (MISCELLANEOUS) ×3 IMPLANT
CHLORAPREP W/TINT 26ML (MISCELLANEOUS) ×3 IMPLANT
CLIP APPLIE 5 13 M/L LIGAMAX5 (MISCELLANEOUS) IMPLANT
CLOSURE STERI-STRIP 1/2X4 (GAUZE/BANDAGES/DRESSINGS) ×1
CLSR STERI-STRIP ANTIMIC 1/2X4 (GAUZE/BANDAGES/DRESSINGS) ×1 IMPLANT
COVER SURGICAL LIGHT HANDLE (MISCELLANEOUS) ×3 IMPLANT
COVER TRANSDUCER ULTRASND (DRAPES) ×3 IMPLANT
DEVICE TROCAR PUNCTURE CLOSURE (ENDOMECHANICALS) ×3 IMPLANT
ELECT REM PT RETURN 9FT ADLT (ELECTROSURGICAL) ×3
ELECTRODE REM PT RTRN 9FT ADLT (ELECTROSURGICAL) ×1 IMPLANT
ENDOLOOP SUT PDS II  0 18 (SUTURE) ×6
ENDOLOOP SUT PDS II 0 18 (SUTURE) ×3 IMPLANT
GAUZE SPONGE 2X2 8PLY STRL LF (GAUZE/BANDAGES/DRESSINGS) IMPLANT
GLOVE BIO SURGEON STRL SZ7.5 (GLOVE) ×3 IMPLANT
GOWN STRL REUS W/ TWL LRG LVL3 (GOWN DISPOSABLE) ×2 IMPLANT
GOWN STRL REUS W/ TWL XL LVL3 (GOWN DISPOSABLE) ×1 IMPLANT
GOWN STRL REUS W/TWL LRG LVL3 (GOWN DISPOSABLE) ×6
GOWN STRL REUS W/TWL XL LVL3 (GOWN DISPOSABLE) ×3
KIT BASIN OR (CUSTOM PROCEDURE TRAY) ×3 IMPLANT
KIT ROOM TURNOVER OR (KITS) ×3 IMPLANT
NDL INSUFFLATION 14GA 120MM (NEEDLE) ×1 IMPLANT
NEEDLE INSUFFLATION 14GA 120MM (NEEDLE) ×3 IMPLANT
NS IRRIG 1000ML POUR BTL (IV SOLUTION) ×3 IMPLANT
PAD ARMBOARD 7.5X6 YLW CONV (MISCELLANEOUS) ×6 IMPLANT
POUCH RETRIEVAL ECOSAC 10 (ENDOMECHANICALS) IMPLANT
POUCH RETRIEVAL ECOSAC 10MM (ENDOMECHANICALS) ×2
SCISSORS LAP 5X35 DISP (ENDOMECHANICALS) ×3 IMPLANT
SET IRRIG TUBING LAPAROSCOPIC (IRRIGATION / IRRIGATOR) ×3 IMPLANT
SLEEVE ENDOPATH XCEL 5M (ENDOMECHANICALS) ×3 IMPLANT
SPECIMEN JAR SMALL (MISCELLANEOUS) ×3 IMPLANT
SPONGE GAUZE 2X2 STER 10/PKG (GAUZE/BANDAGES/DRESSINGS) ×2
SUT MNCRL AB 3-0 PS2 18 (SUTURE) ×3 IMPLANT
SUT SILK 2 0 SH (SUTURE) IMPLANT
SUT VICRYL 0 UR6 27IN ABS (SUTURE) ×2 IMPLANT
TOWEL OR 17X24 6PK STRL BLUE (TOWEL DISPOSABLE) ×3 IMPLANT
TOWEL OR 17X26 10 PK STRL BLUE (TOWEL DISPOSABLE) ×3 IMPLANT
TRAY FOLEY CATH 16FR SILVER (SET/KITS/TRAYS/PACK) ×3 IMPLANT
TRAY LAPAROSCOPIC MC (CUSTOM PROCEDURE TRAY) ×3 IMPLANT
TROCAR XCEL NON-BLD 11X100MML (ENDOMECHANICALS) ×3 IMPLANT
TROCAR XCEL NON-BLD 5MMX100MML (ENDOMECHANICALS) ×3 IMPLANT
TUBING INSUFFLATION (TUBING) ×3 IMPLANT

## 2015-01-17 NOTE — Discharge Summary (Signed)
  Physician Discharge Summary  Patient ID: Heather Knight MRN: [id number] DOB/AGE: 36-May-1982 36 y.o.  Admit date: 01/16/2015 Discharge date: 01/17/2015  Admission Diagnoses:  Acute appendicitis  Discharge Diagnoses:  Acute non perforated appendicitis History of Kidney stones Hx of GERD Headaches   Active Problems:   Acute appendicitis   PROCEDURES: Laparoscopic appendectomy, 01/16/15, Dr. Marchia Meiers Course:  Patient is a 34 year old female with a 2 day history of abdominal pain, nausea, vomiting, decreased appetite. Patient states that secondary to continued symptoms she proceeded to the ER for further evaluation. Upon evaluation ER patient underwent a CT scan as well as laboratory studies. Laboratory studies reveal a white count 22.9. CT scan reveals a dilated appendix consistent with acute appendicitis. Pt admitted early AM and taken to the OR early from the ED.  Post op she has done well and is ready for dc the following AM.  She had a complaint of a headache post op but this was relieved after 1 Fiorcet, and she was anxious for discharge.    Condition on d/c:  Improved    Disposition: 01-Home or Self Care     Medication List    ASK your doctor about these medications        DAYSEE 0.15-0.03 &0.01 MG tablet  Generic drug:  Levonorgestrel-Ethinyl Estradiol  Take 1 tablet by mouth daily.     HYDROcodone-acetaminophen 5-325 MG tablet  Commonly known as:  NORCO/VICODIN  Take 1 tablet by mouth every 6 (six) hours as needed for moderate pain.     hydrocortisone 2.5 % ointment  Apply topically 2 (two) times daily.     omeprazole 20 MG capsule  Commonly known as:  PRILOSEC  Take 1 capsule (20 mg total) by mouth daily.     ondansetron 8 MG disintegrating tablet  Commonly known as:  ZOFRAN-ODT  Take 8 mg by mouth every 8 (eight) hours as needed for nausea or vomiting.     oxyCODONE-acetaminophen 5-325 MG tablet  Commonly known as:  ROXICET  Take 2  tablets by mouth every 4 (four) hours as needed. May take 1-2 tablets every 4-6 hours as needed for pain     promethazine 25 MG tablet  Commonly known as:  PHENERGAN  Take 25 mg by mouth every 6 (six) hours as needed for nausea or vomiting.         SignedEarnstine Regal 01/17/2015, 3:25 PM

## 2015-01-17 NOTE — ED Provider Notes (Signed)
Signed out to me to follow-up on CT scan. Patient has had 2 day history of nausea, vomiting and generalized abdominal pain which has now become focal to the right lower quadrant. She has a moderate leukocytosis and no significant tenderness in the right lower quadrant on examination. CT scan performed and does show evidence of appendicitis. Consult general surgery for admission and surgery.  Filed Vitals:   01/17/15 0100 01/17/15 0115  BP: 114/73 109/76  Pulse: 83 84  Temp:    Resp: 19 20     Orpah Greek, MD 01/17/15 0206

## 2015-01-17 NOTE — Anesthesia Preprocedure Evaluation (Signed)
Anesthesia Evaluation  Patient identified by MRN, date of birth, ID band Patient awake    Reviewed: Allergy & Precautions, NPO status , Patient's Chart, lab work & pertinent test results  Airway Mallampati: II  TM Distance: >3 FB Neck ROM: Full    Dental  (+) Dental Advisory Given, Teeth Intact   Pulmonary neg pulmonary ROS,    breath sounds clear to auscultation       Cardiovascular negative cardio ROS   Rhythm:Regular Rate:Normal     Neuro/Psych negative neurological ROS     GI/Hepatic GERD  ,Patient received Oral Contrast Agents,Acute appendicitis   Endo/Other  negative endocrine ROS  Renal/GU negative Renal ROS     Musculoskeletal negative musculoskeletal ROS (+)   Abdominal   Peds  Hematology negative hematology ROS (+)   Anesthesia Other Findings   Reproductive/Obstetrics                             Lab Results  Component Value Date   WBC 22.9* 01/16/2015   HGB 16.0* 01/17/2015   HCT 47.0* 01/17/2015   MCV 89.0 01/16/2015   PLT 157 01/03/2014   Lab Results  Component Value Date   CREATININE 0.70 01/17/2015   BUN 6 01/17/2015   NA 138 01/17/2015   K 3.7 01/17/2015   CL 100* 01/17/2015   CO2 24 08/16/2012    Anesthesia Physical Anesthesia Plan  ASA: II and emergent  Anesthesia Plan: General   Post-op Pain Management:    Induction: Intravenous  Airway Management Planned: Oral ETT  Additional Equipment:   Intra-op Plan:   Post-operative Plan: Extubation in OR  Informed Consent: I have reviewed the patients History and Physical, chart, labs and discussed the procedure including the risks, benefits and alternatives for the proposed anesthesia with the patient or authorized representative who has indicated his/her understanding and acceptance.   Dental advisory given  Plan Discussed with: CRNA  Anesthesia Plan Comments:         Anesthesia Quick  Evaluation

## 2015-01-17 NOTE — H&P (Signed)
Heather Knight is an 34 y.o. female.   Chief Complaint: Abdominal pain HPI: Patient is a 34 year old female with a 2 day history of abdominal pain, nausea, vomiting, decreased appetite. Patient states that secondary to continued symptoms she proceeded to the ER for further evaluation. Upon evaluation ER patient underwent a CT scan as well as laboratory studies. Laboratory studies reveal a white count 22.9. CT scan reveals a dilated appendix consistent with acute appendicitis.  Surgery consult was obtained for further management.  Past Medical History  Diagnosis Date  . Renal disorder   . Kidney stones   . GERD (gastroesophageal reflux disease)     Past Surgical History  Procedure Laterality Date  . Dilation and evacuation N/A 01/03/2014    Procedure: DILATATION AND EVACUATION;  Surgeon: Farrel Gobble. Harrington Challenger, MD;  Location: [facility];  Service: Gynecology;  Laterality: N/A;    History reviewed. No pertinent family history. Social History:  reports that she has never smoked. She does not have any smokeless tobacco history on file. She reports that she does not drink alcohol or use illicit drugs.  Allergies: No Known Allergies   (Not in a hospital admission)  Results for orders placed or performed during the hospital encounter of 01/16/15 (from the past 48 hour(s))  Lipase, blood     Status: None   Collection Time: 01/16/15  8:27 PM  Result Value Ref Range   Lipase 17 11 - 51 U/L  Urinalysis, Routine w reflex microscopic (not at [facility])     Status: Abnormal   Collection Time: 01/16/15  8:27 PM  Result Value Ref Range   Color, Urine AMBER (A) YELLOW    Comment: BIOCHEMICALS MAY BE AFFECTED BY COLOR   APPearance CLOUDY (A) CLEAR   Specific Gravity, Urine 1.017 1.005 - 1.030   pH 6.0 5.0 - 8.0   Glucose, UA NEGATIVE NEGATIVE mg/dL   Hgb urine dipstick MODERATE (A) NEGATIVE   Bilirubin Urine NEGATIVE NEGATIVE   Ketones, ur >80 (A) NEGATIVE mg/dL   Protein, ur NEGATIVE NEGATIVE mg/dL   Nitrite  NEGATIVE NEGATIVE   Leukocytes, UA NEGATIVE NEGATIVE  Urine microscopic-add on     Status: Abnormal   Collection Time: 01/16/15  8:27 PM  Result Value Ref Range   Squamous Epithelial / LPF 0-5 (A) NONE SEEN    Comment: Please note change in reference range.   WBC, UA 0-5 0 - 5 WBC/hpf    Comment: Please note change in reference range.   RBC / HPF 6-30 0 - 5 RBC/hpf    Comment: Please note change in reference range.   Bacteria, UA FEW (A) NONE SEEN    Comment: Please note change in reference range.   Urine-Other MUCOUS PRESENT   I-Stat CG4 Lactic Acid, ED  (not at [facility])     Status: None   Collection Time: 01/16/15  8:36 PM  Result Value Ref Range   Lactic Acid, Venous 1.02 0.5 - 2.0 mmol/L  POC urine preg, ED (not at [facility])     Status: None   Collection Time: 01/16/15  8:40 PM  Result Value Ref Range   Preg Test, Ur NEGATIVE NEGATIVE    Comment:        THE SENSITIVITY OF THIS METHODOLOGY IS >24 mIU/mL   I-Stat Chem 8, ED     Status: Abnormal   Collection Time: 01/17/15 12:51 AM  Result Value Ref Range   Sodium 138 135 - 145 mmol/L   Potassium 3.7 3.5 -  5.1 mmol/L   Chloride 100 (L) 101 - 111 mmol/L   BUN 6 6 - 20 mg/dL   Creatinine, Ser 0.70 0.44 - 1.00 mg/dL   Glucose, Bld 65 65 - 99 mg/dL   Calcium, Ion 1.12 1.12 - 1.23 mmol/L   TCO2 24 0 - 100 mmol/L   Hemoglobin 16.0 (H) 12.0 - 15.0 g/dL   HCT 47.0 (H) 36.0 - 46.0 %   Ct Abdomen Pelvis W Contrast  01/17/2015  CLINICAL DATA:  Right-sided abdominal pain radiating to the lower pelvis. Nausea and vomiting today. Right lower quadrant pain. EXAM: CT ABDOMEN AND PELVIS WITH CONTRAST TECHNIQUE: Multidetector CT imaging of the abdomen and pelvis was performed using the standard protocol following bolus administration of intravenous contrast. CONTRAST:  15mL OMNIPAQUE IOHEXOL 300 MG/ML  SOLN COMPARISON:  None. FINDINGS: Lung bases are clear. Circumscribed low-attenuation lesions in the left lobe of the liver measuring 15 mm diameter  and in the right lobe of the liver measuring 15 mm diameter. These likely represent small cysts. No other focal lesions identified. The gallbladder, spleen, pancreas, adrenal glands, abdominal aorta, inferior vena cava, and retroperitoneal lymph nodes are unremarkable. Prominent renal pelvis bilaterally but greater on the right. No obstructing stone or hydroureter. There is an nonobstructing stones seen in the mid right kidney measuring 2 mm diameter. Stomach is decompressed. Small bowel are decompressed. Colon is not abnormally distended and is filled with scattered stool. No free air or free fluid in the abdomen. Abdominal wall musculature appears intact. Pelvis: The appendix is markedly distended and thick walled with diameter of about 2 cm. There is infiltration in the periappendiceal fat with small amount of fluid in the right lower quadrant and pelvis. Changes are consistent with acute appendicitis. No abscess. Reactive thickening of the sigmoid colon wall. Scattered diverticula in the sigmoid colon. Uterus and ovaries are not enlarged. Bladder wall is not thickened. No pelvic mass or lymphadenopathy. No destructive bone lesions. IMPRESSION: Distended and thick-walled appendix with prominent stranding in the periappendiceal and pelvic fat. Fluid in the right lower quadrant and pelvis. Changes consistent with acute appendicitis. No focal abscess. Electronically Signed   By: Lucienne Capers M.D.   On: 01/17/2015 01:53   Dg Abd 2 Views  01/16/2015  CLINICAL DATA:  34 year old female with a history of abdominal pain and vomiting. Pain localizes to lower abdomen and mid epigastric region. EXAM: ABDOMEN - 2 VIEW COMPARISON:  Prior abdominal radiographs 04/14/2009 FINDINGS: The bowel gas pattern is normal. There is no evidence of free air. No radio-opaque calculi or other significant radiographic abnormality is seen. IMPRESSION: Negative. Electronically Signed   By: Jacqulynn Cadet M.D.   On: 01/16/2015 19:50     Review of Systems  Constitutional: Negative.   HENT: Negative.   Eyes: Negative.   Respiratory: Negative.   Cardiovascular: Negative.   Gastrointestinal: Positive for heartburn, nausea, vomiting, abdominal pain and diarrhea.  Genitourinary: Negative.   Musculoskeletal: Negative.   Skin: Negative.   Neurological: Negative.     Blood pressure 109/76, pulse 84, temperature 98.8 F (37.1 C), temperature source Oral, resp. rate 20, height 5\' 1"  (1.549 m), weight 72.576 kg (160 lb), last menstrual period 01/10/2015, SpO2 97 %, unknown if currently breastfeeding. Physical Exam  Constitutional: She appears well-developed and well-nourished.  HENT:  Head: Normocephalic and atraumatic.  Eyes: Conjunctivae and EOM are normal. Pupils are equal, round, and reactive to light.  Neck: Normal range of motion. Neck supple.  Cardiovascular: Normal rate, regular  rhythm and normal heart sounds.   Respiratory: Effort normal and breath sounds normal.  GI: Soft. Bowel sounds are normal. There is tenderness (RLQ).     Assessment/Plan 34 y/o M with acute appendicitis 1. Admit, IV abx, NPO 2. To OR for Lap appy    Rosario Jacks., Yobany Vroom 01/17/2015, 3:04 AM

## 2015-01-17 NOTE — ED Notes (Signed)
Called CT, spoke to [facility], as patient is ready for transport. They acknowledge, will pick up the patient as soon as possible.

## 2015-01-17 NOTE — ED Notes (Signed)
Family at bedside. 

## 2015-01-17 NOTE — Anesthesia Procedure Notes (Signed)
Procedure Name: Intubation Date/Time: 01/17/2015 4:57 AM Performed by: Hollie Salk Z Pre-anesthesia Checklist: Patient identified, Timeout performed, Emergency Drugs available, Suction available and Patient being monitored Patient Re-evaluated:Patient Re-evaluated prior to inductionOxygen Delivery Method: Circle system utilized Preoxygenation: Pre-oxygenation with 100% oxygen Intubation Type: IV induction, Rapid sequence and Cricoid Pressure applied Laryngoscope Size: Mac and 3 Grade View: Grade I Tube type: Oral Tube size: 7.0 mm Number of attempts: 1 Airway Equipment and Method: Stylet Placement Confirmation: ETT inserted through vocal cords under direct vision,  breath sounds checked- equal and bilateral and positive ETCO2 Secured at: 21 cm Tube secured with: Tape Dental Injury: Teeth and Oropharynx as per pre-operative assessment

## 2015-01-17 NOTE — Op Note (Signed)
01/17/2015  5:57 AM  PATIENT:  Heather Knight  34 y.o. female  PRE-OPERATIVE DIAGNOSIS:  acute appendicitis  POST-OPERATIVE DIAGNOSIS:  acute non perforated appendicitis  PROCEDURE:  Procedure(s): APPENDECTOMY LAPAROSCOPIC (N/A)  SURGEON:  Surgeon(s) and Role:    * Ralene Ok, MD - Primary  ANESTHESIA:   local and general  EBL: 5cc  Total I/O In: 1000 [I.V.:1000] Out: 150 [Urine:150]  BLOOD ADMINISTERED:none  DRAINS: none   LOCAL MEDICATIONS USED:  BUPIVICAINE   SPECIMEN:  Source of Specimen:  appendix  DISPOSITION OF SPECIMEN:  PATHOLOGY  COUNTS:  YES  TOURNIQUET:  * No tourniquets in log *  DICTATION: .Dragon Dictation Complications: none  Counts: reported as correct x 2  Findings:  The patient had an acutely inflammed non perforated appendix  Specimen: Appendix  Indications for procedure:  The patient is a 34 year old female with a history of periumbilical pain localized in the right lower quadrant patient had a CT scan which revealed signs consistent with acute appendicitis the patient back in for laparoscopic appendectomy.  Details of the procedure:The patient was taken back to the operating room. The patient was placed in supine position with bilateral SCDs in place.  A foley catheter was place. The patient was prepped and draped in the usual sterile fashion.  After appropriate anitbiotics were confirmed, a time-out was confirmed and all facts were verified.    A pneumoperitoneum of 14 mmHg was obtained via a Veress needle technique in the left lower quadrant quadrant.  A 5 mm trocar and 5 mm camera then placed intra-abdominally there is no injury to any intra-abdominal organs a 10 mm infraumbilical port was placed and direct visualization as was a 5 mm port in the suprapubic area.   The appendix was identified and seen to be nonperforated.  The appendix was cleaned down to the appendiceal base. The mesoappendix was then incised and the appendiceal artery  was cauterized.  The the appendiceal base was clean.  At this time an Endoloop was placed proximallyx2 and one distally and the appendix was transected between these 2. A retrieval bag was then placed into the abdomen and the specimen placed in the bag. The appendiceal stump was cauterized. We evacuate the fluid from the pelvis until the effluent was clear.  The appendix and retrieval  bag was then retrieved via the supraumbilical port. #1 Vicryl was used to reapproximate the fascia at the umbilical port site x3. The skin was reapproximated all port sites 3-0 Monocryl subcuticular fashion. The skin was dressed with steri-strips, guaze, and tape.  The patient had the foley removed. The patient was awakened from general anesthesia was taken to recovery room in stable condition.     PLAN OF CARE: Admit for overnight observation  PATIENT DISPOSITION:  PACU - hemodynamically stable.   Delay start of Pharmacological VTE agent (>24hrs) due to surgical blood loss or risk of bleeding: not applicable

## 2015-01-17 NOTE — Care Management Note (Signed)
Case Management Note  Patient Details  Name: Heather Knight MRN: [id number] Date of Birth: 08-Sep-1980  Subjective/Objective:                    Action/Plan:   Expected Discharge Date:  01/18/15               Expected Discharge Plan:  Home/Self Care  In-House Referral:     Discharge planning Services     Post Acute Care Choice:    Choice offered to:     DME Arranged:    DME Agency:     HH Arranged:    HH Agency:     Status of Service:  In process, will continue to follow  Medicare Important Message Given:    Date Medicare IM Given:    Medicare IM give by:    Date Additional Medicare IM Given:    Additional Medicare Important Message give by:     If discussed at [facility] of Stay Meetings, dates discussed:    Additional Comments: Initial UR completed  Marilu Favre, RN 01/17/2015, 3:36 PM

## 2015-01-17 NOTE — Transfer of Care (Signed)
Immediate Anesthesia Transfer of Care Note  Patient: Heather Knight  Procedure(s) Performed: Procedure(s): APPENDECTOMY LAPAROSCOPIC (N/A)  Patient Location: PACU  Anesthesia Type:General  Level of Consciousness: awake, alert , oriented and patient cooperative  Airway & Oxygen Therapy: Patient Spontanous Breathing and Patient connected to nasal cannula oxygen  Post-op Assessment: Report given to RN and Post -op Vital signs reviewed and stable  Post vital signs: Reviewed and stable  Last Vitals:  Filed Vitals:   01/17/15 0330 01/17/15 0345  BP: 113/77 119/79  Pulse: 85 81  Temp:    Resp: 18 18    Complications: No apparent anesthesia complications

## 2015-01-18 ENCOUNTER — Encounter: Payer: Self-pay | Admitting: General Surgery

## 2015-01-18 LAB — GC/CHLAMYDIA PROBE AMP (~~LOC~~) NOT AT ARMC
CHLAMYDIA, DNA PROBE: NEGATIVE
NEISSERIA GONORRHEA: NEGATIVE

## 2015-01-18 LAB — CBC
HEMATOCRIT: 32.7 % — AB (ref 36.0–46.0)
HEMOGLOBIN: 10.7 g/dL — AB (ref 12.0–15.0)
MCH: 30.3 pg (ref 26.0–34.0)
MCHC: 32.7 g/dL (ref 30.0–36.0)
MCV: 92.6 fL (ref 78.0–100.0)
Platelets: 174 10*3/uL (ref 150–400)
RBC: 3.53 MIL/uL — ABNORMAL LOW (ref 3.87–5.11)
RDW: 12.7 % (ref 11.5–15.5)
WBC: 10.5 10*3/uL (ref 4.0–10.5)

## 2015-01-18 LAB — BASIC METABOLIC PANEL
ANION GAP: 6 (ref 5–15)
CHLORIDE: 108 mmol/L (ref 101–111)
CO2: 25 mmol/L (ref 22–32)
Calcium: 8 mg/dL — ABNORMAL LOW (ref 8.9–10.3)
Creatinine, Ser: 0.64 mg/dL (ref 0.44–1.00)
GFR calc Af Amer: >60 mL/min (ref >60)
GFR calc non Af Amer: >60 mL/min (ref >60)
Glucose, Bld: 96 mg/dL (ref 65–99)
POTASSIUM: 3.6 mmol/L (ref 3.5–5.1)
SODIUM: 139 mmol/L (ref 135–145)

## 2015-01-18 MED ORDER — BUTALBITAL-APAP-CAFFEINE 50-325-40 MG PO TABS
1.0000 | ORAL_TABLET | Freq: Four times a day (QID) | ORAL | Status: DC | PRN
  Administered 2015-01-18: 2 via ORAL
  Filled 2015-01-18: qty 2

## 2015-01-18 MED ORDER — IBUPROFEN 200 MG PO TABS: ORAL_TABLET | ORAL | Status: DC

## 2015-01-18 MED ORDER — IBUPROFEN 600 MG PO TABS: 600.0000 mg | ORAL_TABLET | Freq: Four times a day (QID) | ORAL | Status: DC | PRN

## 2015-01-18 MED ORDER — HYDROCODONE-ACETAMINOPHEN 5-325 MG PO TABS: 1.0000 | ORAL_TABLET | ORAL | Status: DC | PRN

## 2015-01-18 MED ORDER — ACETAMINOPHEN 325 MG PO TABS
650.0000 mg | ORAL_TABLET | Freq: Four times a day (QID) | ORAL | Status: DC | PRN
Start: 2015-01-18 — End: 2017-07-03

## 2015-01-18 MED ORDER — IBUPROFEN 400 MG PO TABS
400.0000 mg | ORAL_TABLET | Freq: Once | ORAL | Status: AC
  Administered 2015-01-18: 400 mg via ORAL
  Filled 2015-01-18: qty 1

## 2015-01-18 NOTE — Progress Notes (Signed)
1 Day Post-Op  Subjective: She seems to be doing OK from her appendix, but having headaches.  She has these at home and attributes them to her menstrual cycle most of the time.  She has taken various thing, I will give her some Fiorcet and see if that helps. Ask for something for nausea earlier this AM, worried about it at home too. Objective: Vital signs in last 24 hours: Temp:  [98.5 F (36.9 C)-99.2 F (37.3 C)] 98.6 F (37 C) (11/23 0543) Pulse Rate:  [86-104] 86 (11/23 0543) Resp:  [16-18] 16 (11/23 0543) BP: (90-122)/(25-68) 110/68 mmHg (11/23 0543) SpO2:  [98 %-99 %] 98 % (11/23 0543)  Regular diet PO 960 3 liter positive fluid balance recorded Afebrile, VSS Labs OK H/H down some Intake/Output from previous day: 11/22 0701 - 11/23 0700 In: 2581.8 [P.O.:960; I.V.:1621.8] Out: 1050 [Urine:1050] Intake/Output this shift:    General appearance: alert, cooperative and no distress Resp: clear to auscultation bilaterally GI: soft sore, sites all look good.  Tolerating diet.  Lab Results:   Recent Labs  01/17/15 0713 01/18/15 0229  WBC 14.5* 10.5  HGB 12.4 10.7*  HCT 36.1 32.7*  PLT 189 174    BMET  Recent Labs  01/17/15 0051 01/17/15 0713 01/18/15 0229  NA 138  --  139  K 3.7  --  3.6  CL 100*  --  108  CO2  --   --  25  GLUCOSE 65  --  96  BUN 6  --  <5*  CREATININE 0.70 0.76 0.64  CALCIUM  --   --  8.0*   PT/INR No results for input(s): LABPROT, INR in the last 72 hours.  No results for input(s): AST, ALT, ALKPHOS, BILITOT, PROT, ALBUMIN in the last 168 hours.   Lipase     Component Value Date/Time   LIPASE 17 01/16/2015 2027     Studies/Results: Ct Abdomen Pelvis W Contrast  01/17/2015  CLINICAL DATA:  Right-sided abdominal pain radiating to the lower pelvis. Nausea and vomiting today. Right lower quadrant pain. EXAM: CT ABDOMEN AND PELVIS WITH CONTRAST TECHNIQUE: Multidetector CT imaging of the abdomen and pelvis was performed using the  standard protocol following bolus administration of intravenous contrast. CONTRAST:  153mL OMNIPAQUE IOHEXOL 300 MG/ML  SOLN COMPARISON:  None. FINDINGS: Lung bases are clear. Circumscribed low-attenuation lesions in the left lobe of the liver measuring 15 mm diameter and in the right lobe of the liver measuring 15 mm diameter. These likely represent small cysts. No other focal lesions identified. The gallbladder, spleen, pancreas, adrenal glands, abdominal aorta, inferior vena cava, and retroperitoneal lymph nodes are unremarkable. Prominent renal pelvis bilaterally but greater on the right. No obstructing stone or hydroureter. There is an nonobstructing stones seen in the mid right kidney measuring 2 mm diameter. Stomach is decompressed. Small bowel are decompressed. Colon is not abnormally distended and is filled with scattered stool. No free air or free fluid in the abdomen. Abdominal wall musculature appears intact. Pelvis: The appendix is markedly distended and thick walled with diameter of about 2 cm. There is infiltration in the periappendiceal fat with small amount of fluid in the right lower quadrant and pelvis. Changes are consistent with acute appendicitis. No abscess. Reactive thickening of the sigmoid colon wall. Scattered diverticula in the sigmoid colon. Uterus and ovaries are not enlarged. Bladder wall is not thickened. No pelvic mass or lymphadenopathy. No destructive bone lesions. IMPRESSION: Distended and thick-walled appendix with prominent stranding in the  periappendiceal and pelvic fat. Fluid in the right lower quadrant and pelvis. Changes consistent with acute appendicitis. No focal abscess. Electronically Signed   By: Lucienne Capers M.D.   On: 01/17/2015 01:53   Dg Abd 2 Views  01/16/2015  CLINICAL DATA:  34 year old female with a history of abdominal pain and vomiting. Pain localizes to lower abdomen and mid epigastric region. EXAM: ABDOMEN - 2 VIEW COMPARISON:  Prior abdominal  radiographs 04/14/2009 FINDINGS: The bowel gas pattern is normal. There is no evidence of free air. No radio-opaque calculi or other significant radiographic abnormality is seen. IMPRESSION: Negative. Electronically Signed   By: Jacqulynn Cadet M.D.   On: 01/16/2015 19:50    Medications: . enoxaparin (LOVENOX) injection  40 mg Subcutaneous Q24H  . Influenza vac split quadrivalent PF  0.5 mL Intramuscular Tomorrow-1000    Assessment/Plan Acute non perforated appendicitis S/p Laparoscopic appendectomy 01/17/15, Dr. Ralene Ok History of Kidney stones Hx of GERD Antibiotics:  Pre op only DVT:  Lovenox/SCD   Plan:  Treat her headache, mobilize, see how she does, home later today.    LOS: 1 day    Heather Knight 01/18/2015

## 2015-01-18 NOTE — Progress Notes (Signed)
Verbal order received from [place], [place] not allowing discharge order to go through.     Alan Mulder to be D/C'd  per MD order. Discussed with the patient and all questions fully answered.  VSS, Skin clean, dry and intact without evidence of skin break down, no evidence of skin tears noted.  IV catheter discontinued intact. Site without signs and symptoms of complications. Dressing and pressure applied.  An After Visit Summary was printed and given to the patient. Patient received prescription.  D/c education completed with patient/family including follow up instructions, medication list, d/c activities limitations if indicated, with other d/c instructions as indicated by MD - patient able to verbalize understanding, all questions fully answered.   Patient instructed to return to ED, call 911, or call MD for any changes in condition.   Patient to be escorted via [place], and D/C home via private auto.

## 2015-01-18 NOTE — Discharge Instructions (Signed)
Laparoscopic Appendectomy, Adult, Care After °Refer to this sheet in the next few weeks. These instructions provide you with information on caring for yourself after your procedure. Your caregiver may also give you more specific instructions. Your treatment has been planned according to current medical practices, but problems sometimes occur. Call your caregiver if you have any problems or questions after your procedure. °HOME CARE INSTRUCTIONS °· Do not drive while taking narcotic pain medicines. °· Use stool softener if you become constipated from your pain medicines. °· Change your bandages (dressings) as directed. °· Keep your wounds clean and dry. You may wash the wounds gently with soap and water. Gently pat the wounds dry with a clean towel. °· Do not take baths, swim, or use hot tubs for 10 days, or as instructed by your caregiver. °· Only take over-the-counter or prescription medicines for pain, discomfort, or fever as directed by your caregiver. °· You may continue your normal diet as directed. °· Do not lift more than 10 pounds (4.5 kg) or play contact sports for 3 weeks, or as directed. °· Slowly increase your activity after surgery. °· Take deep breaths to avoid getting a lung infection (pneumonia). °SEEK MEDICAL CARE IF: °· You have redness, swelling, or increasing pain in your wounds. °· You have pus coming from your wounds. °· You have drainage from a wound that lasts longer than 1 day. °· You notice a bad smell coming from the wounds or dressing. °· Your wound edges break open after stitches (sutures) have been removed. °· You notice increasing pain in the shoulders (shoulder strap areas) or near your shoulder blades. °· You develop dizzy episodes or fainting while standing. °· You develop shortness of breath. °· You develop persistent nausea or vomiting. °· You cannot control your bowel functions or lose your appetite. °· You develop diarrhea. °SEEK IMMEDIATE MEDICAL CARE IF:  °· You have a  fever. °· You develop a rash. °· You have difficulty breathing or sharp pains in your chest. °· You develop any reaction or side effects to medicines given. °MAKE SURE YOU: °· Understand these instructions. °· Will watch your condition. °· Will get help right away if you are not doing well or get worse. °  °This information is not intended to replace advice given to you by your health care provider. Make sure you discuss any questions you have with your health care provider. °  °Document Released: 02/11/2005 Document Revised: 06/28/2014 Document Reviewed: 08/01/2014 °Elsevier Interactive Patient Education ©2016 Elsevier Inc[employer] ° °CCS ______[facility] °LAPAROSCOPIC SURGERY: POST OP INSTRUCTIONS °Always review your discharge instruction sheet given to you by the facility where your surgery was performed. °IF YOU HAVE DISABILITY OR FAMILY LEAVE FORMS, YOU MUST BRING THEM TO THE OFFICE FOR PROCESSING.   °DO NOT GIVE THEM TO YOUR DOCTOR. ° °1. A prescription for pain medication may be given to you upon discharge.  Take your pain medication as prescribed, if needed.  If narcotic pain medicine is not needed, then you may take acetaminophen (Tylenol) or ibuprofen (Advil) as needed. °2. Take your usually prescribed medications unless otherwise directed. °3. If you need a refill on your pain medication, please contact your pharmacy.  They will contact our office to request authorization. Prescriptions will not be filled after 5pm or on week-ends. °4. You should follow a light diet the first few days after arrival home, such as soup and crackers, etc.  Be sure to include lots of fluids daily. °5. Most   patients will experience some swelling and bruising in the area of the incisions.  Ice packs will help.  Swelling and bruising can take several days to resolve.  °6. It is common to experience some constipation if taking pain medication after surgery.  Increasing fluid intake and taking a stool softener (such as  Colace) will usually help or prevent this problem from occurring.  A mild laxative (Milk of Magnesia or Miralax) should be taken according to package instructions if there are no bowel movements after 48 hours. °7. Unless discharge instructions indicate otherwise, you may remove your bandages 24-48 hours after surgery, and you may shower at that time.  You may have steri-strips (small skin tapes) in place directly over the incision.  These strips should be left on the skin for 7-10 days.  If your surgeon used skin glue on the incision, you may shower in 24 hours.  The glue will flake off over the next 2-3 weeks.  Any sutures or staples will be removed at the office during your follow-up visit. °8. ACTIVITIES:  You may resume regular (light) daily activities beginning the next day--such as daily self-care, walking, climbing stairs--gradually increasing activities as tolerated.  You may have sexual intercourse when it is comfortable.  Refrain from any heavy lifting or straining until approved by your doctor. °a. You may drive when you are no longer taking prescription pain medication, you can comfortably wear a seatbelt, and you can safely maneuver your car and apply brakes. °b. RETURN TO WORK:  __________________________________________________________ °9. You should see your doctor in the office for a follow-up appointment approximately 2-3 weeks after your surgery.  Make sure that you call for this appointment within a day or two after you arrive home to insure a convenient appointment time. °10. OTHER INSTRUCTIONS: __________________________________________________________________________________________________________________________ __________________________________________________________________________________________________________________________ °WHEN TO CALL YOUR DOCTOR: °1. Fever over 101.0 °2. Inability to urinate °3. Continued bleeding from incision. °4. Increased pain, redness, or drainage from the  incision. °5. Increasing abdominal pain ° °The clinic staff is available to answer your questions during regular business hours.  Please don’t hesitate to call and ask to speak to one of the nurses for clinical concerns.  If you have a medical emergency, go to the nearest emergency room or call 911.  A surgeon from [facility] is always on call at the hospital. °[address], [address], [place], [place]  [address] ? [address] [address], [place], [place]   [address] °[phone] ? [phone] ? FAX [phone] °Web site: [email or web address] ° °

## 2015-01-23 ENCOUNTER — Encounter (HOSPITAL_COMMUNITY): Payer: Self-pay | Admitting: General Surgery

## 2015-01-24 NOTE — Anesthesia Postprocedure Evaluation (Signed)
Anesthesia Post Note  Patient: Jamara Chasin Belger  Procedure(s) Performed: Procedure(s) (LRB): APPENDECTOMY LAPAROSCOPIC (N/A)  Patient location during evaluation: PACU Anesthesia Type: General Level of consciousness: awake and alert Pain management: pain level controlled Vital Signs Assessment: post-procedure vital signs reviewed and stable Respiratory status: spontaneous breathing Cardiovascular status: blood pressure returned to baseline Postop Assessment: no signs of nausea or vomiting Anesthetic complications: no    Last Vitals:  Filed Vitals:   01/18/15 0124 01/18/15 0543  BP: 122/67 110/68  Pulse: 104 86  Temp: 36.9 C 37 C  Resp: 16 16    Last Pain:  Filed Vitals:   01/18/15 0744  PainSc: 5                  Tiajuana Amass

## 2015-04-14 MED FILL — ASHLYNA 0.15-0.03-0.01 MG T: 0.15-0.03 & | 90 days supply | Qty: 91 | Fill #0

## 2015-05-26 DIAGNOSIS — E6609 Other obesity due to excess calories: Secondary | ICD-10-CM | POA: Diagnosis not present

## 2015-05-26 DIAGNOSIS — Z6831 Body mass index (BMI) 31.0-31.9, adult: Secondary | ICD-10-CM | POA: Diagnosis not present

## 2015-05-26 MED FILL — PHENTERMINE 37.5 MG TABLET: 37.5 | 30 days supply | Qty: 30 | Fill #0

## 2015-06-22 DIAGNOSIS — Z6829 Body mass index (BMI) 29.0-29.9, adult: Secondary | ICD-10-CM | POA: Diagnosis not present

## 2015-06-22 DIAGNOSIS — E6609 Other obesity due to excess calories: Secondary | ICD-10-CM | POA: Diagnosis not present

## 2015-06-28 MED FILL — PHENTERMINE 37.5 MG TABLET: 37.5 | 30 days supply | Qty: 30 | Fill #0

## 2015-07-12 MED FILL — ASHLYNA 0.15-0.03-0.01 MG T: 0.15-0.03 & | 90 days supply | Qty: 91 | Fill #0

## 2015-07-13 DIAGNOSIS — R5383 Other fatigue: Secondary | ICD-10-CM | POA: Diagnosis not present

## 2015-07-13 DIAGNOSIS — Z6829 Body mass index (BMI) 29.0-29.9, adult: Secondary | ICD-10-CM | POA: Diagnosis not present

## 2015-07-13 DIAGNOSIS — E6609 Other obesity due to excess calories: Secondary | ICD-10-CM | POA: Diagnosis not present

## 2015-07-21 ENCOUNTER — Other Ambulatory Visit: Payer: Self-pay | Admitting: Obstetrics and Gynecology

## 2015-07-21 DIAGNOSIS — Z124 Encounter for screening for malignant neoplasm of cervix: Secondary | ICD-10-CM | POA: Diagnosis not present

## 2015-07-21 DIAGNOSIS — Z01419 Encounter for gynecological examination (general) (routine) without abnormal findings: Secondary | ICD-10-CM | POA: Diagnosis not present

## 2015-07-21 DIAGNOSIS — Z6828 Body mass index (BMI) 28.0-28.9, adult: Secondary | ICD-10-CM | POA: Diagnosis not present

## 2015-07-22 DIAGNOSIS — R350 Frequency of micturition: Secondary | ICD-10-CM | POA: Diagnosis not present

## 2015-07-22 DIAGNOSIS — Z1329 Encounter for screening for other suspected endocrine disorder: Secondary | ICD-10-CM | POA: Diagnosis not present

## 2015-07-22 DIAGNOSIS — Z130 Encounter for screening for diseases of the blood and blood-forming organs and certain disorders involving the immune mechanism: Secondary | ICD-10-CM | POA: Diagnosis not present

## 2015-07-22 DIAGNOSIS — Z13220 Encounter for screening for lipoid disorders: Secondary | ICD-10-CM | POA: Diagnosis not present

## 2015-07-25 LAB — CYTOLOGY - PAP

## 2015-07-25 MED FILL — URO-MP CAPSULE: 118 | 7 days supply | Qty: 28 | Fill #0

## 2015-08-02 MED FILL — PHENTERMINE 37.5 MG TABLET: 37.5 | 30 days supply | Qty: 30 | Fill #0

## 2015-09-08 DIAGNOSIS — Z30430 Encounter for insertion of intrauterine contraceptive device: Secondary | ICD-10-CM | POA: Diagnosis not present

## 2015-09-08 DIAGNOSIS — Z3202 Encounter for pregnancy test, result negative: Secondary | ICD-10-CM | POA: Diagnosis not present

## 2015-11-17 DIAGNOSIS — Z30431 Encounter for routine checking of intrauterine contraceptive device: Secondary | ICD-10-CM | POA: Diagnosis not present

## 2015-11-17 DIAGNOSIS — Z6830 Body mass index (BMI) 30.0-30.9, adult: Secondary | ICD-10-CM | POA: Diagnosis not present

## 2015-12-06 DIAGNOSIS — E6609 Other obesity due to excess calories: Secondary | ICD-10-CM | POA: Diagnosis not present

## 2015-12-06 DIAGNOSIS — R300 Dysuria: Secondary | ICD-10-CM | POA: Diagnosis not present

## 2015-12-06 DIAGNOSIS — Z6829 Body mass index (BMI) 29.0-29.9, adult: Secondary | ICD-10-CM | POA: Diagnosis not present

## 2015-12-06 DIAGNOSIS — N390 Urinary tract infection, site not specified: Secondary | ICD-10-CM | POA: Diagnosis not present

## 2015-12-06 DIAGNOSIS — Z79899 Other long term (current) drug therapy: Secondary | ICD-10-CM | POA: Diagnosis not present

## 2015-12-15 DIAGNOSIS — N390 Urinary tract infection, site not specified: Secondary | ICD-10-CM | POA: Diagnosis not present

## 2016-05-17 DIAGNOSIS — Z1231 Encounter for screening mammogram for malignant neoplasm of breast: Secondary | ICD-10-CM | POA: Diagnosis not present

## 2016-05-20 ENCOUNTER — Other Ambulatory Visit: Payer: Self-pay | Admitting: Obstetrics and Gynecology

## 2016-05-20 DIAGNOSIS — R928 Other abnormal and inconclusive findings on diagnostic imaging of breast: Secondary | ICD-10-CM

## 2016-05-21 ENCOUNTER — Other Ambulatory Visit: Payer: Self-pay | Admitting: Obstetrics and Gynecology

## 2016-05-21 ENCOUNTER — Ambulatory Visit
Admission: RE | Admit: 2016-05-21 | Discharge: 2016-05-21 | Disposition: A | Discharge status: Home / Self Care | Payer: 59 | Source: Ambulatory Visit | Attending: Obstetrics and Gynecology | Admitting: Obstetrics and Gynecology

## 2016-05-21 DIAGNOSIS — R928 Other abnormal and inconclusive findings on diagnostic imaging of breast: Secondary | ICD-10-CM

## 2016-05-21 DIAGNOSIS — R921 Mammographic calcification found on diagnostic imaging of breast: Secondary | ICD-10-CM | POA: Diagnosis not present

## 2016-05-21 DIAGNOSIS — N6489 Other specified disorders of breast: Secondary | ICD-10-CM | POA: Diagnosis not present

## 2016-05-23 ENCOUNTER — Ambulatory Visit
Admission: RE | Admit: 2016-05-23 | Discharge: 2016-05-23 | Disposition: A | Discharge status: Home / Self Care | Payer: 59 | Source: Ambulatory Visit | Attending: Obstetrics and Gynecology | Admitting: Obstetrics and Gynecology

## 2016-05-23 ENCOUNTER — Other Ambulatory Visit: Payer: 59

## 2016-05-23 ENCOUNTER — Other Ambulatory Visit: Payer: Self-pay | Admitting: Obstetrics and Gynecology

## 2016-05-23 DIAGNOSIS — R921 Mammographic calcification found on diagnostic imaging of breast: Secondary | ICD-10-CM

## 2016-05-23 DIAGNOSIS — D241 Benign neoplasm of right breast: Secondary | ICD-10-CM | POA: Diagnosis not present

## 2016-05-26 HISTORY — PX: BREAST BIOPSY: SHX20

## 2016-05-30 ENCOUNTER — Other Ambulatory Visit: Payer: Self-pay | Admitting: Plastic Surgery

## 2016-05-30 DIAGNOSIS — N62 Hypertrophy of breast: Secondary | ICD-10-CM | POA: Diagnosis not present

## 2016-05-30 DIAGNOSIS — M542 Cervicalgia: Secondary | ICD-10-CM | POA: Diagnosis not present

## 2016-05-30 DIAGNOSIS — M25519 Pain in unspecified shoulder: Secondary | ICD-10-CM | POA: Diagnosis not present

## 2016-05-30 DIAGNOSIS — M546 Pain in thoracic spine: Secondary | ICD-10-CM | POA: Diagnosis not present

## 2016-05-30 MED FILL — HYDROCODON-APAP 5-325: 5-325 | 2 days supply | Qty: 16 | Fill #0

## 2016-08-13 MED FILL — TRANSDERM-SCOP 1.5 MG/72HR: 1 | 12 days supply | Qty: 4 | Fill #0

## 2016-09-10 MED FILL — TRANSDERM-SCOP 1.5 MG/72HR: 1 | 12 days supply | Qty: 4 | Fill #1

## 2016-09-23 MED FILL — PROMETHAZINE 25 MG TABLET: 25 | 10 days supply | Qty: 30 | Fill #0

## 2016-09-23 MED FILL — TRANSDERM-SCOP 1.5 MG/72HR: 1 | 15 days supply | Qty: 5 | Fill #0

## 2016-11-07 DIAGNOSIS — Z01419 Encounter for gynecological examination (general) (routine) without abnormal findings: Secondary | ICD-10-CM | POA: Diagnosis not present

## 2016-11-07 DIAGNOSIS — Z124 Encounter for screening for malignant neoplasm of cervix: Secondary | ICD-10-CM | POA: Diagnosis not present

## 2017-07-03 ENCOUNTER — Encounter: Payer: Self-pay | Admitting: Physician Assistant

## 2017-07-03 ENCOUNTER — Ambulatory Visit (INDEPENDENT_AMBULATORY_CARE_PROVIDER_SITE_OTHER): Payer: 59 | Admitting: Physician Assistant

## 2017-07-03 VITALS — BP 124/82 | HR 84 | Temp 98.7°F | Resp 16 | Ht 60.5 in | Wt 160.0 lb

## 2017-07-03 DIAGNOSIS — E669 Obesity, unspecified: Secondary | ICD-10-CM

## 2017-07-03 DIAGNOSIS — E785 Hyperlipidemia, unspecified: Secondary | ICD-10-CM | POA: Diagnosis not present

## 2017-07-03 DIAGNOSIS — Z1329 Encounter for screening for other suspected endocrine disorder: Secondary | ICD-10-CM | POA: Diagnosis not present

## 2017-07-03 DIAGNOSIS — Z0000 Encounter for general adult medical examination without abnormal findings: Secondary | ICD-10-CM | POA: Diagnosis not present

## 2017-07-03 DIAGNOSIS — Z131 Encounter for screening for diabetes mellitus: Secondary | ICD-10-CM

## 2017-07-03 DIAGNOSIS — D649 Anemia, unspecified: Secondary | ICD-10-CM

## 2017-07-03 DIAGNOSIS — Z23 Encounter for immunization: Secondary | ICD-10-CM | POA: Diagnosis not present

## 2017-07-03 DIAGNOSIS — Z13220 Encounter for screening for lipoid disorders: Secondary | ICD-10-CM

## 2017-07-03 DIAGNOSIS — Z803 Family history of malignant neoplasm of breast: Secondary | ICD-10-CM | POA: Diagnosis not present

## 2017-07-03 NOTE — Patient Instructions (Signed)

## 2017-07-03 NOTE — Progress Notes (Addendum)
Patient: Heather Knight Female    DOB: [birth year]   37 y.o.   MRN: [id number] Visit Date: 08/01/2017  Today's Provider: [name], PA-C   Chief Complaint  Patient presents with  . Establish Care  . Weight Gain   Subjective:    HPI   Heather Knight is a 37 y/o woman presenting today to establish care. Lives in [place], [place]. Has two sons ages 105 and 35. Works as Arboriculturist. Married for 19 years to her husband.  Has IUD for birth control, no longer taking OCP.  Infrequently takes omeprazole.   She is requesting Phentermine. She has been on this before which has been helpful for weight loss. She has cycled 3 mo on and 3 mo off. Has maintained weight for period of time after this. She currently drinks Premier protein shake for breakfast. Eats Chick [employer] salad for lunch. Frequently eats out because of busy schedule. Does some cardio like treadmill at the gym. She denies chest pain, heart attack, drug abuse, alcohol use, HTN, hyperthyroidism, previous adverse effects from this medication.      No Known Allergies   Current Outpatient Medications:  .  ibuprofen (ADVIL,MOTRIN) 200 MG tablet, You can take 2-3 tablets every 6 hours as needed for pain.  I would use this first, and then the narcotic second., Disp: , Rfl:  .  levonorgestrel (MIRENA) 20 MCG/24HR IUD, 1 each by Intrauterine route once., Disp: , Rfl:  .  omeprazole (PRILOSEC) 20 MG capsule, Take 1 capsule (20 mg total) by mouth daily. (Patient taking differently: Take 20 mg by mouth daily as needed (acid reflux). ), Disp: 30 capsule, Rfl: 0 .  DAYSEE 0.15-0.03 &0.01 MG tablet, Take 1 tablet by mouth daily., Disp: , Rfl: 4 .  fluconazole (DIFLUCAN) 150 MG tablet, Take first pill on day 1. Take 2nd pill three days later if symptoms persist. (Patient not taking: Reported on 08/01/2017), Disp: 2 tablet, Rfl: 0 .  phentermine 37.5 MG capsule, Take 1 capsule (37.5 mg total) by mouth every morning., Disp: 30 capsule, Rfl:  0  Review of Systems  Constitutional: Negative for activity change, appetite change, chills, diaphoresis, fatigue, fever and unexpected weight change.  HENT: Negative.   Eyes: Negative.   Respiratory: Negative.   Cardiovascular: Negative.   Gastrointestinal: Negative.   Endocrine: Negative.   Genitourinary: Negative.   Musculoskeletal: Negative.   Skin: Negative.   Allergic/Immunologic: Negative.   Neurological: Negative.  Negative for dizziness, light-headedness and headaches.  Hematological: Negative.   Psychiatric/Behavioral: Negative.    Past Surgical History:  Procedure Laterality Date  . BREAST REDUCTION SURGERY    . DILATION AND EVACUATION N/A 01/03/2014   Procedure: DILATATION AND EVACUATION;  Surgeon: Farrel Gobble. Harrington Challenger, MD;  Location: [facility];  Service: Gynecology;  Laterality: N/A;  . LAPAROSCOPIC APPENDECTOMY N/A 01/17/2015   Procedure: APPENDECTOMY LAPAROSCOPIC;  Surgeon: Ralene Ok, MD;  Location: [facility];  Service: General;  Laterality: N/A;   Family History  Problem Relation Age of Onset  . Breast cancer Mother 35  . Diabetes Mother   . Hypertension Mother   . Hypertension Father   . Healthy Sister   . Heart attack Maternal Grandmother   . Diabetes Maternal Grandmother     Social History   Tobacco Use  . Smoking status: Never Smoker  . Smokeless tobacco: Never Used  Substance Use Topics  . Alcohol use: No   Objective:   BP 124/82 (  BP Location: Right Arm, Patient Position: Sitting, Cuff Size: Normal)   Pulse 84   Temp 98.7 F (37.1 C) (Oral)   Resp 16   Ht 5' 0.5" (1.537 m)   Wt 160 lb (72.6 kg)   BMI 30.73 kg/m  Vitals:   07/03/17 1508  BP: 124/82  Pulse: 84  Resp: 16  Temp: 98.7 F (37.1 C)  TempSrc: Oral  Weight: 160 lb (72.6 kg)  Height: 5' 0.5" (1.537 m)     Physical Exam  Constitutional: She is oriented to person, place, and time. She appears well-developed and well-nourished.  HENT:  Head: Normocephalic and atraumatic.  Right  Ear: Tympanic membrane and external ear normal.  Left Ear: Tympanic membrane and external ear normal.  Nose: Nose normal.  Mouth/Throat: Oropharynx is clear and moist. No oropharyngeal exudate.  Eyes: Conjunctivae are normal. Right eye exhibits no discharge. Left eye exhibits no discharge.  Neck: Neck supple.  Cardiovascular: Normal rate and regular rhythm.  Pulmonary/Chest: Effort normal and breath sounds normal.  Abdominal: Soft. Bowel sounds are normal.  Musculoskeletal: She exhibits no edema, tenderness or deformity.  Lymphadenopathy:    She has no cervical adenopathy.  Neurological: She is alert and oriented to person, place, and time.  Skin: Skin is warm and dry.  Psychiatric: She has a normal mood and affect. Her behavior is normal.        Assessment & Plan:     1. Annual physical exam   2. Obesity without serious comorbidity, unspecified classification, unspecified obesity type  Have counseled that medication is not a substitute for lifestyle changes. She has lost 5 lbs in the past month, which is positive. She can focus on reducing meals outside the home and meal planning, overall calorie restriction. I will under no circumstances provide this prescription for longer than three months, regardless of the outcome and I have told patient this explicitly. She agrees to come to office monthly to check weight loss, BP, assess for side effects, and for refills. Medication will be stopped if there is no weight loss or if there are adverse effects. She agrees to sign controlled substance agreement. I have reviewed NCCS[place] as below:    3. Diabetes mellitus screening  - Comprehensive Metabolic Panel (CMET)  4. Anemia, unspecified type  - CBC with Differential  5. Elevated fasting lipid profile   6. Screening, lipid  - Lipid Profile  7. Thyroid disorder screening  - TSH  8. Need for Tdap  Updated today.  The entirety of the information documented in the History of  Present Illness, Review of Systems and Physical Exam were personally obtained by me. Portions of this information were initially documented by Ashley Royalty, CMA and reviewed by me for thoroughness and accuracy.   I have spent 45 minutes with this patient, >50% of which was spent on counseling and coordination of care.       [name], PA-C  [facility]

## 2017-07-04 LAB — COMPREHENSIVE METABOLIC PANEL
ALT: 14 IU/L (ref 0–32)
AST: 17 IU/L (ref 0–40)
Albumin/Globulin Ratio: 2.2 (ref 1.2–2.2)
Albumin: 4.6 g/dL (ref 3.5–5.5)
Alkaline Phosphatase: 61 IU/L (ref 39–117)
BUN/Creatinine Ratio: 14 (ref 9–23)
BUN: 11 mg/dL (ref 6–20)
Bilirubin Total: 0.5 mg/dL (ref 0.0–1.2)
CO2: 21 mmol/L (ref 20–29)
Calcium: 9.5 mg/dL (ref 8.7–10.2)
Chloride: 104 mmol/L (ref 96–106)
Creatinine, Ser: 0.77 mg/dL (ref 0.57–1.00)
GFR calc Af Amer: 112 mL/min/{1.73_m2} (ref >59)
GFR calc non Af Amer: 98 mL/min/{1.73_m2} (ref >59)
Globulin, Total: 2.1 g/dL (ref 1.5–4.5)
Glucose: 75 mg/dL (ref 65–99)
Potassium: 4.2 mmol/L (ref 3.5–5.2)
Sodium: 141 mmol/L (ref 134–144)
Total Protein: 6.7 g/dL (ref 6.0–8.5)

## 2017-07-04 LAB — CBC WITH DIFFERENTIAL/PLATELET
Basophils Absolute: 0 10*3/uL (ref 0.0–0.2)
Basos: 0 %
EOS (ABSOLUTE): 0.1 10*3/uL (ref 0.0–0.4)
Eos: 1 %
Hematocrit: 41.3 % (ref 34.0–46.6)
Hemoglobin: 14 g/dL (ref 11.1–15.9)
Immature Grans (Abs): 0 10*3/uL (ref 0.0–0.1)
Immature Granulocytes: 0 %
Lymphocytes Absolute: 1.4 10*3/uL (ref 0.7–3.1)
Lymphs: 18 %
MCH: 30.4 pg (ref 26.6–33.0)
MCHC: 33.9 g/dL (ref 31.5–35.7)
MCV: 90 fL (ref 79–97)
Monocytes Absolute: 0.8 10*3/uL (ref 0.1–0.9)
Monocytes: 11 %
Neutrophils Absolute: 5.1 10*3/uL (ref 1.4–7.0)
Neutrophils: 70 %
Platelets: 203 10*3/uL (ref 150–379)
RBC: 4.61 x10E6/uL (ref 3.77–5.28)
RDW: 13.2 % (ref 12.3–15.4)
WBC: 7.4 10*3/uL (ref 3.4–10.8)

## 2017-07-04 LAB — LIPID PANEL
Chol/HDL Ratio: 3.7 ratio (ref 0.0–4.4)
Cholesterol, Total: 169 mg/dL (ref 100–199)
HDL: 46 mg/dL (ref >39)
LDL Calculated: 110 mg/dL — ABNORMAL HIGH (ref 0–99)
Triglycerides: 64 mg/dL (ref 0–149)
VLDL Cholesterol Cal: 13 mg/dL (ref 5–40)

## 2017-07-04 LAB — TSH: TSH: 2.4 u[IU]/mL (ref 0.450–4.500)

## 2017-07-04 NOTE — Addendum Note (Signed)
Addended by: Ashley Royalty E on: 07/04/2017 08:47 AM   Modules accepted: Orders

## 2017-07-07 ENCOUNTER — Other Ambulatory Visit: Payer: Self-pay | Admitting: Obstetrics and Gynecology

## 2017-07-07 DIAGNOSIS — R928 Other abnormal and inconclusive findings on diagnostic imaging of breast: Secondary | ICD-10-CM

## 2017-07-08 ENCOUNTER — Ambulatory Visit (INDEPENDENT_AMBULATORY_CARE_PROVIDER_SITE_OTHER): Payer: 59 | Admitting: Physician Assistant

## 2017-07-08 ENCOUNTER — Encounter: Payer: Self-pay | Admitting: Physician Assistant

## 2017-07-08 VITALS — BP 118/78 | HR 88 | Temp 98.8°F | Resp 16 | Wt 163.0 lb

## 2017-07-08 DIAGNOSIS — J029 Acute pharyngitis, unspecified: Secondary | ICD-10-CM

## 2017-07-08 DIAGNOSIS — Z6831 Body mass index (BMI) 31.0-31.9, adult: Secondary | ICD-10-CM | POA: Diagnosis not present

## 2017-07-08 DIAGNOSIS — E669 Obesity, unspecified: Secondary | ICD-10-CM | POA: Diagnosis not present

## 2017-07-08 DIAGNOSIS — J020 Streptococcal pharyngitis: Secondary | ICD-10-CM | POA: Diagnosis not present

## 2017-07-08 LAB — POCT RAPID STREP A (OFFICE): Rapid Strep A Screen: POSITIVE — AB

## 2017-07-08 MED ORDER — AMOXICILLIN 875 MG PO TABS: 875.0000 mg | ORAL_TABLET | Freq: Two times a day (BID) | ORAL | 0 refills | Status: AC

## 2017-07-08 MED ORDER — PHENTERMINE HCL 37.5 MG PO CAPS: 37.5000 mg | ORAL_CAPSULE | ORAL | 0 refills | Status: DC

## 2017-07-08 MED FILL — PHENTERMINE 37.5 MG TABLET: 37.5 | 30 days supply | Qty: 30 | Fill #0

## 2017-07-08 NOTE — Progress Notes (Signed)
Patient: Heather Knight Female    DOB: 1981/01/15   37 y.o.   MRN: [id number] Visit Date: 07/08/2017  Today's Provider: [name], PA-C   Chief Complaint  Patient presents with  . Sore Throat   Subjective:    Sore Throat   This is a new problem. Neither side of throat is experiencing more pain than the other. Associated symptoms include headaches, a hoarse voice, neck pain and swollen glands. Pertinent negatives include no abdominal pain, congestion, coughing, diarrhea, drooling, ear discharge, ear pain, plugged ear sensation, shortness of breath, stridor, trouble swallowing or vomiting. She has had no exposure to strep or mono. She has tried NSAIDs for the symptoms. The treatment provided mild relief.   Additionally, I have reviewed patient's labs and they are normal. Will send Phentermine 37.5 mg to [facility].  No Known Allergies   Current Outpatient Medications:  .  ibuprofen (ADVIL,MOTRIN) 200 MG tablet, You can take 2-3 tablets every 6 hours as needed for pain.  I would use this first, and then the narcotic second., Disp: , Rfl:  .  levonorgestrel (MIRENA) 20 MCG/24HR IUD, 1 each by Intrauterine route once., Disp: , Rfl:  .  omeprazole (PRILOSEC) 20 MG capsule, Take 1 capsule (20 mg total) by mouth daily. (Patient taking differently: Take 20 mg by mouth daily as needed (acid reflux). ), Disp: 30 capsule, Rfl: 0 .  amoxicillin (AMOXIL) 875 MG tablet, Take 1 tablet (875 mg total) by mouth 2 (two) times daily for 7 days., Disp: 14 tablet, Rfl: 0 .  DAYSEE 0.15-0.03 &0.01 MG tablet, Take 1 tablet by mouth daily., Disp: , Rfl: 4 .  phentermine 37.5 MG capsule, Take 1 capsule (37.5 mg total) by mouth every morning., Disp: 30 capsule, Rfl: 0  Review of Systems  Constitutional: Positive for chills, fatigue and fever. Negative for activity change, appetite change, diaphoresis and unexpected weight change.  HENT: Positive for hoarse voice. Negative for  congestion, drooling, ear discharge, ear pain and trouble swallowing.   Respiratory: Negative for cough, shortness of breath and stridor.   Gastrointestinal: Negative for abdominal pain, diarrhea and vomiting.  Musculoskeletal: Positive for arthralgias, myalgias and neck pain. Negative for back pain, gait problem, joint swelling and neck stiffness.  Neurological: Positive for headaches. Negative for dizziness and light-headedness.    Social History   Tobacco Use  . Smoking status: Never Smoker  . Smokeless tobacco: Never Used  Substance Use Topics  . Alcohol use: No   Objective:   BP 118/78 (BP Location: Right Arm, Patient Position: Sitting, Cuff Size: Normal)   Pulse 88   Temp 98.8 F (37.1 C) (Oral)   Resp 16   Wt 163 lb (73.9 kg)   BMI 31.31 kg/m  Vitals:   07/08/17 0956  BP: 118/78  Pulse: 88  Resp: 16  Temp: 98.8 F (37.1 C)  TempSrc: Oral  Weight: 163 lb (73.9 kg)     Physical Exam      Assessment & Plan:     1. Strep pharyngitis  - amoxicillin (AMOXIL) 875 MG tablet; Take 1 tablet (875 mg total) by mouth 2 (two) times daily for 7 days.  Dispense: 14 tablet; Refill: 0  2. Sore throat  - POCT rapid strep A  3. Class 1 obesity without serious comorbidity with body mass index (BMI) of 31.0 to 31.9 in adult, unspecified obesity type  - phentermine 37.5 MG capsule; Take 1 capsule (37.5  mg total) by mouth every morning.  Dispense: 30 capsule; Refill: 0  Return if symptoms worsen or fail to improve.  The entirety of the information documented in the History of Present Illness, Review of Systems and Physical Exam were personally obtained by me. Portions of this information were initially documented by Ashley Royalty, CMA and reviewed by me for thoroughness and accuracy.        [name], PA-C  [facility]

## 2017-07-08 NOTE — Patient Instructions (Signed)

## 2017-07-10 ENCOUNTER — Telehealth: Payer: Self-pay | Admitting: Physician Assistant

## 2017-07-10 DIAGNOSIS — B379 Candidiasis, unspecified: Secondary | ICD-10-CM

## 2017-07-10 MED ORDER — FLUCONAZOLE 150 MG PO TABS: ORAL_TABLET | ORAL | 0 refills | Status: DC

## 2017-07-10 MED FILL — FLUCONAZOLE 150 MG TABS: 150 | 6 days supply | Qty: 2 | Fill #0

## 2017-07-10 NOTE — Telephone Encounter (Signed)
Diflucan sent

## 2017-07-10 NOTE — Telephone Encounter (Signed)
Please review. Thanks!  

## 2017-07-10 NOTE — Telephone Encounter (Signed)
L/M advising below.  

## 2017-07-10 NOTE — Telephone Encounter (Signed)
Pt called saying she was in Tuesday for strep and is taking an antibiotic,  She is now having yeast infection symptoms.  She wants to know if something can be sent to the pharmacy  She uses [facility]  Pt's call back is [phone]  Thanks teri

## 2017-07-11 ENCOUNTER — Ambulatory Visit
Admission: RE | Admit: 2017-07-11 | Discharge: 2017-07-11 | Disposition: A | Discharge status: Home / Self Care | Payer: 59 | Source: Ambulatory Visit | Attending: Obstetrics and Gynecology | Admitting: Obstetrics and Gynecology

## 2017-07-11 ENCOUNTER — Other Ambulatory Visit: Payer: Self-pay | Admitting: Obstetrics and Gynecology

## 2017-07-11 DIAGNOSIS — R928 Other abnormal and inconclusive findings on diagnostic imaging of breast: Secondary | ICD-10-CM

## 2017-07-11 DIAGNOSIS — N6489 Other specified disorders of breast: Secondary | ICD-10-CM | POA: Diagnosis not present

## 2017-07-11 DIAGNOSIS — R922 Inconclusive mammogram: Secondary | ICD-10-CM | POA: Diagnosis not present

## 2017-07-16 ENCOUNTER — Other Ambulatory Visit: Payer: 59

## 2017-07-16 MED ORDER — PHENTERMINE HCL 37.5 MG PO CAPS: 37.5000 mg | ORAL_CAPSULE | ORAL | 0 refills | Status: DC

## 2017-07-16 NOTE — Addendum Note (Signed)
Addended by: [name] on: 07/16/2017 10:37 AM   Modules accepted: Orders

## 2017-07-31 ENCOUNTER — Ambulatory Visit: Payer: 59 | Admitting: Adult Health

## 2017-08-01 ENCOUNTER — Encounter: Payer: Self-pay | Admitting: Physician Assistant

## 2017-08-01 ENCOUNTER — Ambulatory Visit (INDEPENDENT_AMBULATORY_CARE_PROVIDER_SITE_OTHER): Payer: 59 | Admitting: Physician Assistant

## 2017-08-01 VITALS — BP 124/80 | HR 88 | Temp 98.3°F | Resp 16 | Wt 155.0 lb

## 2017-08-01 DIAGNOSIS — E669 Obesity, unspecified: Secondary | ICD-10-CM | POA: Diagnosis not present

## 2017-08-01 DIAGNOSIS — Z6831 Body mass index (BMI) 31.0-31.9, adult: Secondary | ICD-10-CM | POA: Diagnosis not present

## 2017-08-01 NOTE — Patient Instructions (Signed)
Obesity, Adult Obesity is having too much body fat. If you have a BMI of 30 or more, you are obese. BMI is a number that explains how much body fat you have. Obesity is often caused by taking in (consuming) more calories than your body uses. Obesity can cause serious health problems. Changing your lifestyle can help to treat obesity. Follow these instructions at home: Eating and drinking   Follow advice from your doctor about what to eat and drink. Your doctor may tell you to: ? Cut down on (limit) fast foods, sweets, and processed snack foods. ? Choose low-fat options. For example, choose low-fat milk instead of whole milk. ? Eat 5 or more servings of fruits or vegetables every day. ? Eat at home more often. This gives you more control over what you eat. ? Choose healthy foods when you eat out. ? Learn what a healthy portion size is. A portion size is the amount of a certain food that is healthy for you to eat at one time. This is different for each person. ? Keep low-fat snacks available. ? Avoid sugary drinks. These include soda, fruit juice, iced tea that is sweetened with sugar, and flavored milk. ? Eat a healthy breakfast.  Drink enough water to keep your pee (urine) clear or pale yellow.  Do not go without eating for long periods of time (do not fast).  Do not go on popular or trendy diets (fad diets). Physical Activity  Exercise often, as told by your doctor. Ask your doctor: ? What types of exercise are safe for you. ? How often you should exercise.  Warm up and stretch before being active.  Do slow stretching after being active (cool down).  Rest between times of being active. Lifestyle  Limit how much time you spend in front of your TV, computer, or video game system (be less sedentary).  Find ways to reward yourself that do not involve food.  Limit alcohol intake to no more than 1 drink a day for nonpregnant women and 2 drinks a day for men. One drink equals 12 oz  of beer, 5 oz of wine, or 1 oz of hard liquor. General instructions  Keep a weight loss journal. This can help you keep track of: ? The food that you eat. ? The exercise that you do.  Take over-the-counter and prescription medicines only as told by your doctor.  Take vitamins and supplements only as told by your doctor.  Think about joining a support group. Your doctor may be able to help with this.  Keep all follow-up visits as told by your doctor. This is important. Contact a doctor if:  You cannot meet your weight loss goal after you have changed your diet and lifestyle for 6 weeks. This information is not intended to replace advice given to you by your health care provider. Make sure you discuss any questions you have with your health care provider. Document Released: 05/06/2011 Document Revised: 07/20/2015 Document Reviewed: 11/30/2014 Elsevier Interactive Patient Education  2018 Elsevier Inc.  

## 2017-08-01 NOTE — Addendum Note (Signed)
Addended by: [name] on: 08/01/2017 01:29 PM   Modules accepted: Level of Service

## 2017-08-01 NOTE — Progress Notes (Signed)
Patient: Heather Knight Female    DOB: 1980-10-20   37 y.o.   MRN: [id number] Visit Date: 08/01/2017  Today's Provider: [name], PA-C   Chief Complaint  Patient presents with  . Weight Loss    Follow up   Subjective:    HPI   Heather Knight is a 37 y/o woman presenting today for follow up of obesity. She had started phentermine 37.5 mg daily on 07/16/2017. Weight trend as below. She denies chest pain, palpitations, headache. Reports her sleep is somewhat less restful.   Wt Readings from Last 3 Encounters:  08/01/17 155 lb (70.3 kg)  07/08/17 163 lb (73.9 kg)  07/03/17 160 lb (72.6 kg)       No Known Allergies   Current Outpatient Medications:  .  DAYSEE 0.15-0.03 &0.01 MG tablet, Take 1 tablet by mouth daily., Disp: , Rfl: 4 .  ibuprofen (ADVIL,MOTRIN) 200 MG tablet, You can take 2-3 tablets every 6 hours as needed for pain.  I would use this first, and then the narcotic second., Disp: , Rfl:  .  levonorgestrel (MIRENA) 20 MCG/24HR IUD, 1 each by Intrauterine route once., Disp: , Rfl:  .  omeprazole (PRILOSEC) 20 MG capsule, Take 1 capsule (20 mg total) by mouth daily. (Patient taking differently: Take 20 mg by mouth daily as needed (acid reflux). ), Disp: 30 capsule, Rfl: 0 .  phentermine 37.5 MG capsule, Take 1 capsule (37.5 mg total) by mouth every morning., Disp: 30 capsule, Rfl: 0 .  fluconazole (DIFLUCAN) 150 MG tablet, Take first pill on day 1. Take 2nd pill three days later if symptoms persist. (Patient not taking: Reported on 08/01/2017), Disp: 2 tablet, Rfl: 0  Review of Systems  Constitutional: Negative.   Respiratory: Negative.   Cardiovascular: Negative.   Gastrointestinal: Negative.   Neurological: Negative for dizziness, light-headedness and headaches.    Social History   Tobacco Use  . Smoking status: Never Smoker  . Smokeless tobacco: Never Used  Substance Use Topics  . Alcohol use: No   Objective:   BP 124/80 (BP Location: Right Arm,  Patient Position: Sitting, Cuff Size: Normal)   Pulse 88   Temp 98.3 F (36.8 C) (Oral)   Resp 16   Wt 155 lb (70.3 kg)   BMI 29.77 kg/m  Vitals:   08/01/17 1050  BP: 124/80  Pulse: 88  Resp: 16  Temp: 98.3 F (36.8 C)  TempSrc: Oral  Weight: 155 lb (70.3 kg)     Physical Exam  Constitutional: She is oriented to person, place, and time. She appears well-developed and well-nourished.  Cardiovascular: Normal rate and regular rhythm.  Pulmonary/Chest: Effort normal and breath sounds normal.  Neurological: She is alert and oriented to person, place, and time.  Skin: Skin is warm and dry.  Psychiatric: She has a normal mood and affect. Her behavior is normal.        Assessment & Plan:     1. Class 1 obesity without serious comorbidity with body mass index (BMI) of 31.0 to 31.9 in adult, unspecified obesity type  Patient has lost 5 lbs from baseline weight. Making positive changes in terms of exercise and meal choice. Will refill phentermine when appropriate. See her in follow up in August due to this follow up being scheduled slightly early and my being out of the office. I have checked the [place] and there is consistent pharmacy usage, single provider. She will call with any  issues.  Return in about 1 month (around 08/31/2017) for obesity and phentermine .  The entirety of the information documented in the History of Present Illness, Review of Systems and Physical Exam were personally obtained by me. Portions of this information were initially documented by Ashley Royalty, CMA and reviewed by me for thoroughness and accuracy.          [name], PA-C  [facility]

## 2017-08-14 MED FILL — PHENTERMINE 37.5 MG TABLET: 37.5 | 30 days supply | Qty: 30 | Fill #0

## 2017-10-09 ENCOUNTER — Ambulatory Visit: Payer: Self-pay | Admitting: Physician Assistant

## 2017-10-24 ENCOUNTER — Ambulatory Visit: Payer: Self-pay | Admitting: Physician Assistant

## 2017-11-28 ENCOUNTER — Ambulatory Visit: Payer: Self-pay | Admitting: Physician Assistant

## 2018-01-06 ENCOUNTER — Other Ambulatory Visit: Payer: Self-pay | Admitting: Obstetrics and Gynecology

## 2018-01-06 DIAGNOSIS — R928 Other abnormal and inconclusive findings on diagnostic imaging of breast: Secondary | ICD-10-CM

## 2018-01-15 ENCOUNTER — Other Ambulatory Visit: Payer: Self-pay | Admitting: Obstetrics and Gynecology

## 2018-01-15 ENCOUNTER — Ambulatory Visit: Admission: RE | Admit: 2018-01-15 | Payer: 59 | Source: Ambulatory Visit

## 2018-01-15 ENCOUNTER — Ambulatory Visit
Admission: RE | Admit: 2018-01-15 | Discharge: 2018-01-15 | Disposition: A | Discharge status: Home / Self Care | Payer: 59 | Source: Ambulatory Visit | Attending: Obstetrics and Gynecology | Admitting: Obstetrics and Gynecology

## 2018-01-15 DIAGNOSIS — R928 Other abnormal and inconclusive findings on diagnostic imaging of breast: Secondary | ICD-10-CM

## 2018-01-29 ENCOUNTER — Other Ambulatory Visit: Payer: Self-pay | Admitting: Obstetrics and Gynecology

## 2018-01-29 ENCOUNTER — Ambulatory Visit
Admission: RE | Admit: 2018-01-29 | Discharge: 2018-01-29 | Disposition: A | Discharge status: Home / Self Care | Payer: 59 | Source: Ambulatory Visit | Attending: Obstetrics and Gynecology | Admitting: Obstetrics and Gynecology

## 2018-01-29 DIAGNOSIS — R928 Other abnormal and inconclusive findings on diagnostic imaging of breast: Secondary | ICD-10-CM | POA: Diagnosis not present

## 2018-01-29 DIAGNOSIS — N62 Hypertrophy of breast: Secondary | ICD-10-CM | POA: Diagnosis not present

## 2018-01-29 HISTORY — PX: BREAST BIOPSY: SHX20

## 2018-04-30 DIAGNOSIS — M25561 Pain in right knee: Secondary | ICD-10-CM | POA: Diagnosis not present

## 2018-05-11 DIAGNOSIS — M25561 Pain in right knee: Secondary | ICD-10-CM | POA: Diagnosis not present

## 2018-05-15 DIAGNOSIS — M25561 Pain in right knee: Secondary | ICD-10-CM | POA: Diagnosis not present

## 2018-05-18 DIAGNOSIS — M6281 Muscle weakness (generalized): Secondary | ICD-10-CM | POA: Diagnosis not present

## 2018-05-18 DIAGNOSIS — M25561 Pain in right knee: Secondary | ICD-10-CM | POA: Diagnosis not present

## 2018-05-21 DIAGNOSIS — M25561 Pain in right knee: Secondary | ICD-10-CM | POA: Diagnosis not present

## 2018-05-21 DIAGNOSIS — M6281 Muscle weakness (generalized): Secondary | ICD-10-CM | POA: Diagnosis not present

## 2018-05-22 DIAGNOSIS — Z01419 Encounter for gynecological examination (general) (routine) without abnormal findings: Secondary | ICD-10-CM | POA: Diagnosis not present

## 2018-05-22 DIAGNOSIS — Z124 Encounter for screening for malignant neoplasm of cervix: Secondary | ICD-10-CM | POA: Diagnosis not present

## 2018-05-22 LAB — HM PAP SMEAR: HM Pap smear: NEGATIVE

## 2018-05-28 IMAGING — MG STEREOTACTIC VACUUM ASSIST RIGHT
5 series · 6 of 13 positions shown · non-contrast
Comparison: Previous exams.

ADDENDUM:
Pathology revealed FIBROADENOMA WITH CALCIFICATIONS of the Right
breast, lateral. This was found to be concordant by Dr. Suli
Erxleben. Pathology results were discussed with the patient by
telephone. The patient reported doing well after the biopsy with
tenderness at the site. Post biopsy instructions and care were
reviewed and questions were answered. The patient was encouraged to
call The [REDACTED] for any additional
concerns. The patient was instructed to continue with monthly self
breast examinations, clinical follow-up as needed, and to return for
annual mammography at 38. The patient was informed a reminder notice
would be sent regarding this appointment.

Pathology results reported by Yoyi Dupuis, RN on 05/24/2016.
CLINICAL DATA: 35-year-old female presenting for stereotactic
biopsy of right breast calcifications.
EXAM:
Choose 3 BREAST STEREOTACTIC CORE NEEDLE BIOPSY

[R LM (1 of 3)]
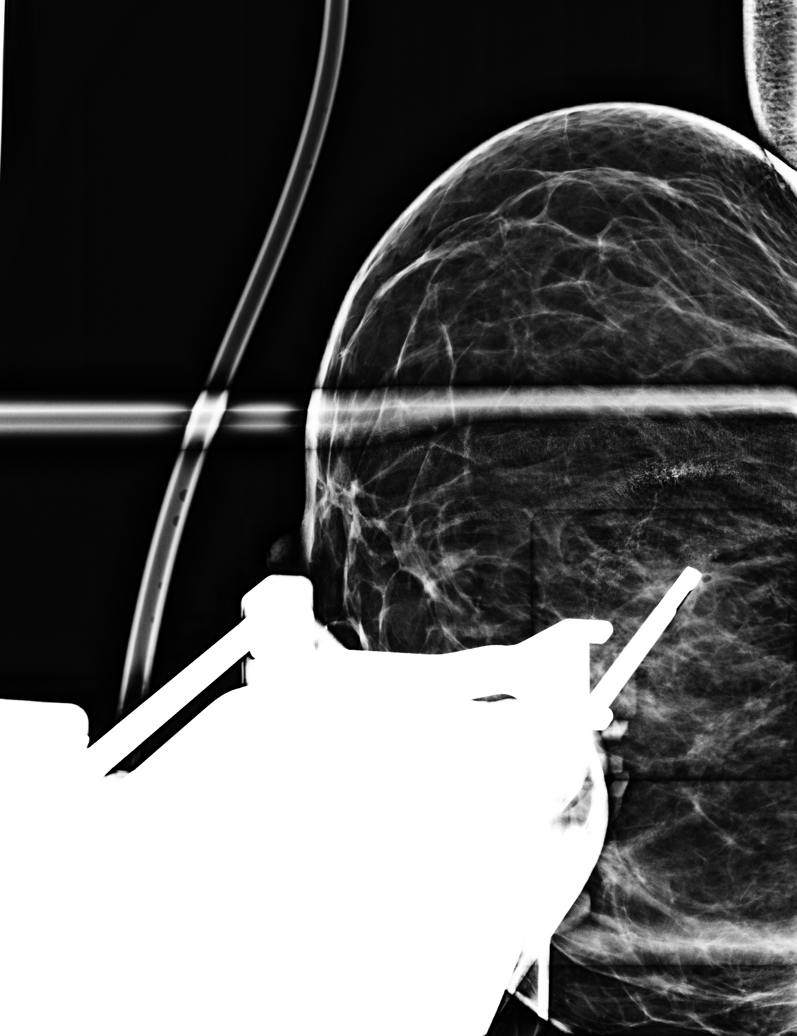

[R LM (2 of 3)]
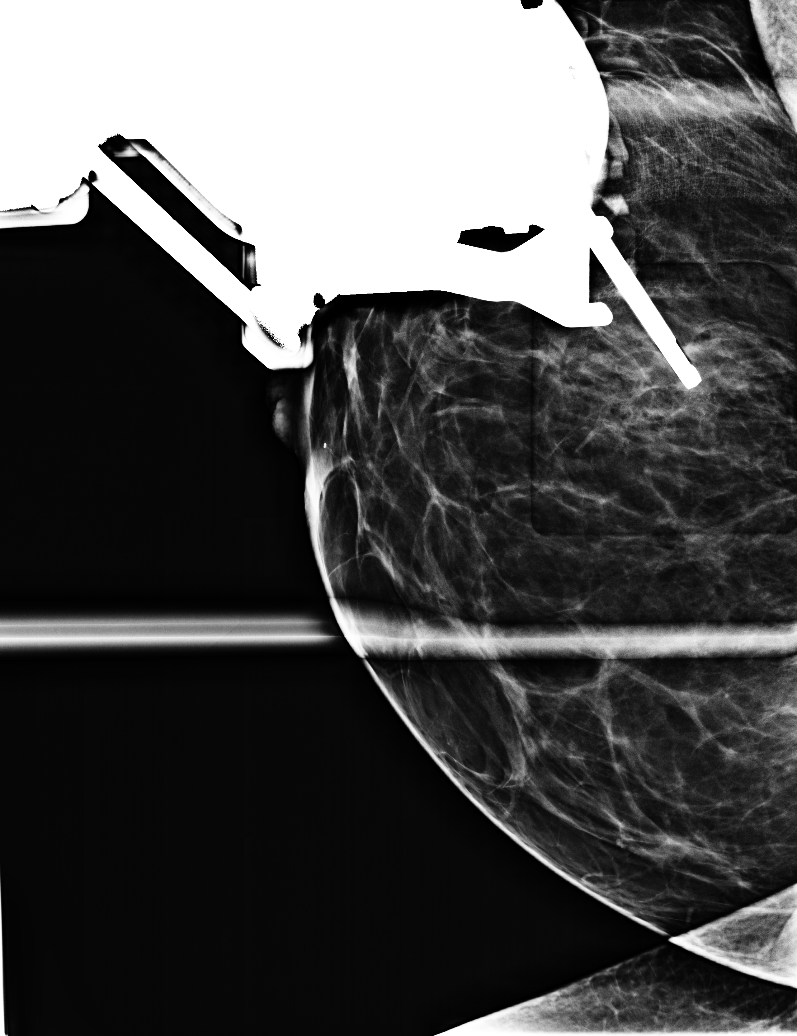

[R LM (3 of 3)]
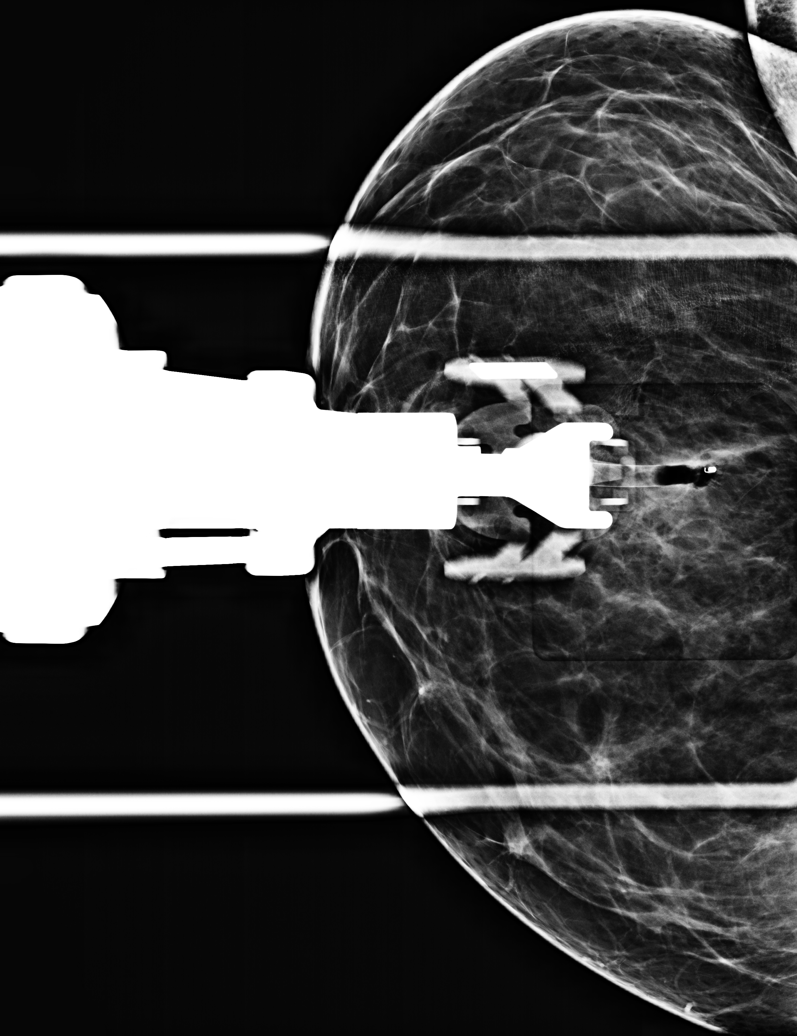

[R LM tomo · 2 of 71 frames shown (1 of 2)]
[frame 23/71]
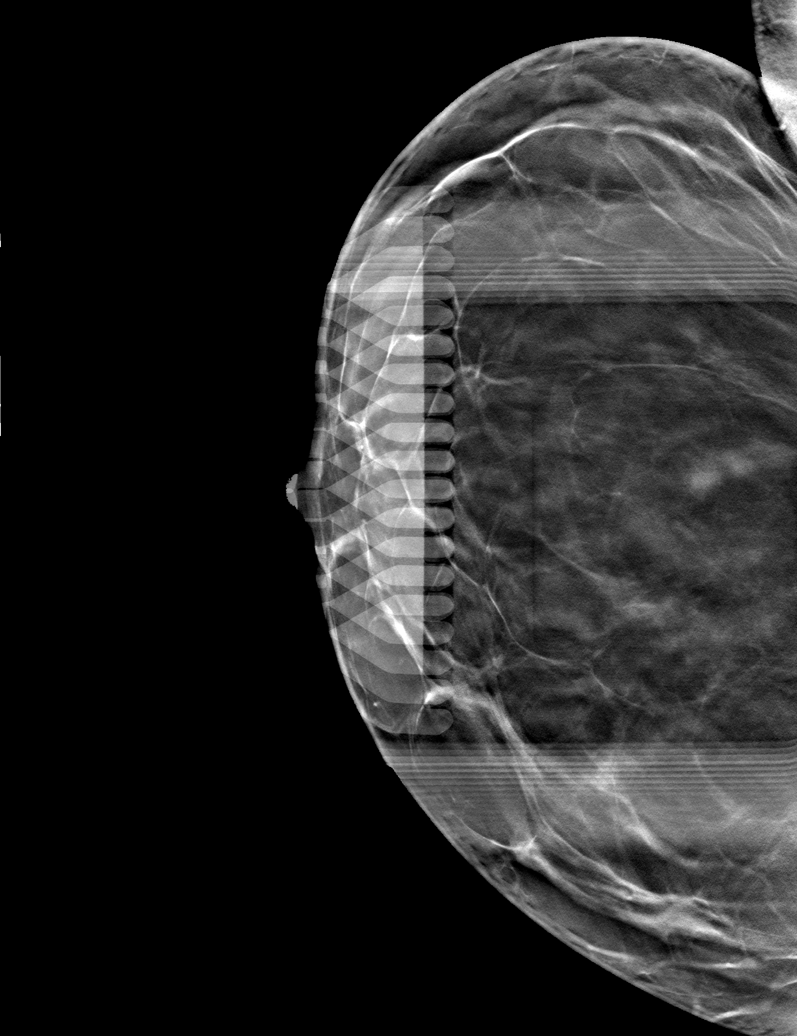
[frame 36/71]
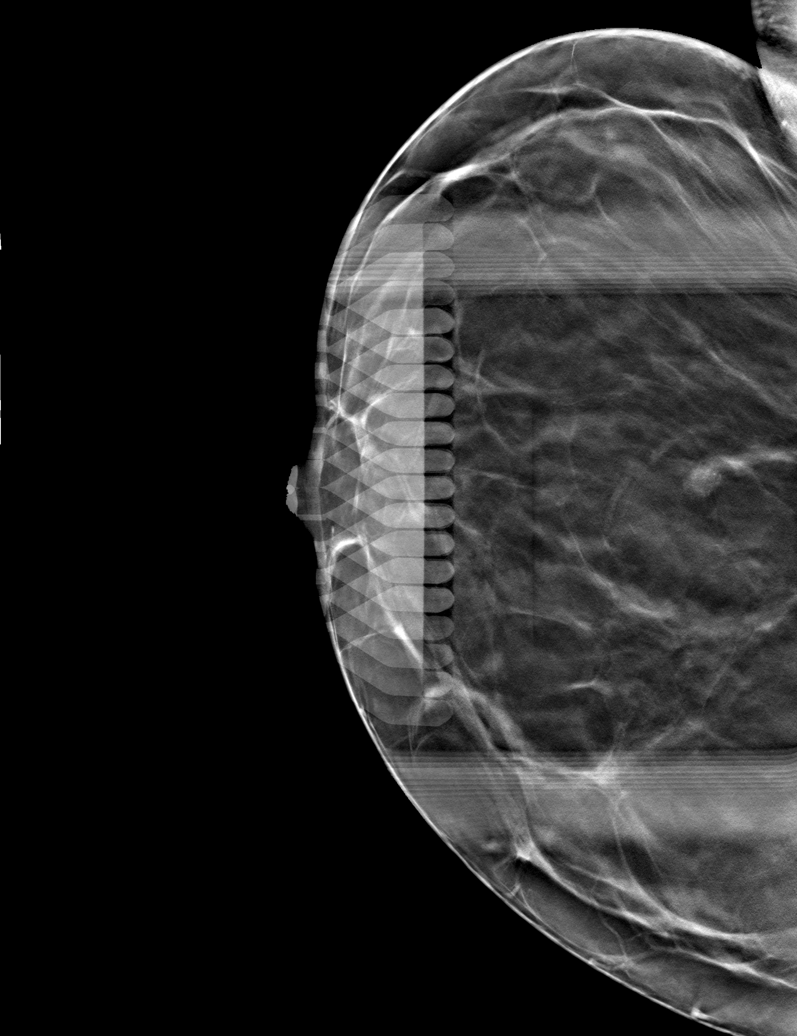

[R LM tomo (2 of 2) · tomo slice 37/74.0]
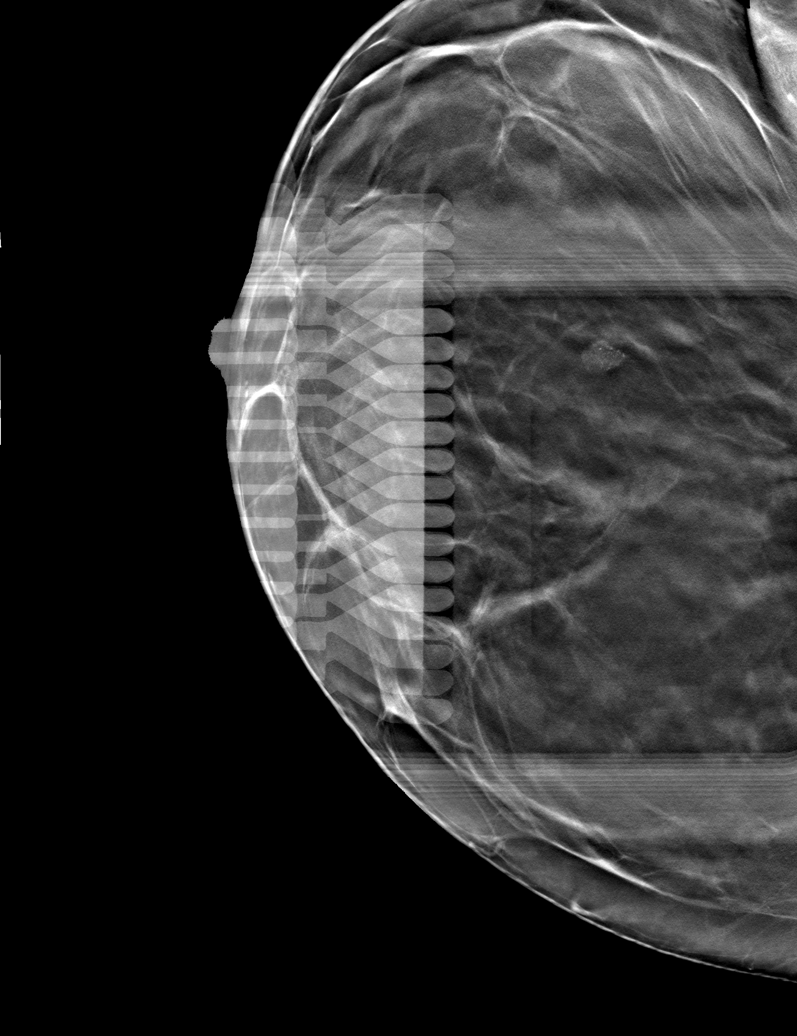

[6 of 13 positions shown; findings below may reference images not displayed]



Using sterile technique and 1% Lidocaine as local anesthetic, under
stereotactic guidance, a 9 gauge vacuum assisted device was used to
perform core needle biopsy of calcifications in the lateral aspect
of the right breast using a lateral approach. Specimen radiograph
was performed showing the bulk of the calcifications within 2 core
samples. Specimens with calcifications are identified for pathology.

At the conclusion of the procedure, a coil shaped tissue marker clip
was deployed into the biopsy cavity. Follow-up 2-view mammogram was
performed and dictated separately.
IMPRESSION: Stereotactic-guided biopsy of calcifications in the lateral right
breast. No apparent complications.

## 2018-11-06 ENCOUNTER — Other Ambulatory Visit: Payer: Self-pay

## 2018-11-06 ENCOUNTER — Encounter: Payer: Self-pay | Admitting: Physician Assistant

## 2018-11-06 ENCOUNTER — Ambulatory Visit (INDEPENDENT_AMBULATORY_CARE_PROVIDER_SITE_OTHER): Payer: 59 | Admitting: Physician Assistant

## 2018-11-06 VITALS — BP 108/82 | HR 81 | Temp 97.3°F | Resp 16 | Ht 61.0 in | Wt 164.6 lb

## 2018-11-06 DIAGNOSIS — L989 Disorder of the skin and subcutaneous tissue, unspecified: Secondary | ICD-10-CM

## 2018-11-06 DIAGNOSIS — Z0000 Encounter for general adult medical examination without abnormal findings: Secondary | ICD-10-CM

## 2018-11-06 DIAGNOSIS — E669 Obesity, unspecified: Secondary | ICD-10-CM

## 2018-11-06 DIAGNOSIS — Z6831 Body mass index (BMI) 31.0-31.9, adult: Secondary | ICD-10-CM

## 2018-11-06 NOTE — Progress Notes (Signed)
Patient: Heather Knight, Female    DOB: [birth year], 38 y.o.   MRN: [id number] Visit Date: 11/06/2018  Today's Provider: [name], PA-C   Chief Complaint  Patient presents with  . Annual Exam   Subjective:     Annual physical exam Heather Knight is a 38 y.o. female who presents today for health maintenance and complete physical. She feels well. She reports exercising no. She reports she is sleeping well. Patient is seen by Dr. Harrington Challenger, [facility], [facility]  PAP - 04/2018 - normal. Currently using Mirena IUD for birth control.   Father recently diagnosed with prostate cancer and had prostatectomy at [facility] this past Monday. Mother had COVID in April.   Tore right meniscus going skiing earlier this year. Saw Dr. Sandie Ano orthopedist in [place]. Did three sessions of PT before COVID. Has gained some weight, decreased walking and increased eating due to Heather Knight.  Wt Readings from Last 3 Encounters:  11/06/18 164 lb 9.6 oz (74.7 kg)  08/01/17 155 lb (70.3 kg)  07/08/17 163 lb (73.9 kg)    -----------------------------------------------------------------   Review of Systems  Constitutional: Negative.   HENT: Negative.   Eyes: Negative.   Respiratory: Negative.   Cardiovascular: Negative.   Gastrointestinal: Negative.   Endocrine: Negative.   Genitourinary: Negative.   Musculoskeletal: Negative.   Skin: Negative.   Allergic/Immunologic: Negative.   Neurological: Negative.   Hematological: Negative.   Psychiatric/Behavioral: Negative.     Social History      She  reports that she has never smoked. She has never used smokeless tobacco. She reports that she does not drink alcohol or use drugs.       Social History   Socioeconomic History  . Marital status: Married    Spouse name: Not on file  . Number of children: Not on file  . Years of education: Not on file  . Highest education level: Not on file  Occupational History  . Not on file   Social Needs  . Financial resource strain: Not on file  . Food insecurity    Worry: Not on file    Inability: Not on file  . Transportation needs    Medical: Not on file    Non-medical: Not on file  Tobacco Use  . Smoking status: Never Smoker  . Smokeless tobacco: Never Used  Substance and Sexual Activity  . Alcohol use: No  . Drug use: No  . Sexual activity: Yes    Birth control/protection: Pill, I.U.D.  Lifestyle  . Physical activity    Days per week: Not on file    Minutes per session: Not on file  . Stress: Not on file  Relationships  . Social Herbalist on phone: Not on file    Gets together: Not on file    Attends religious service: Not on file    Active member of club or organization: Not on file    Attends meetings of clubs or organizations: Not on file    Relationship status: Not on file  Other Topics Concern  . Not on file  Social History Narrative  . Not on file    Past Medical History:  Diagnosis Date  . GERD (gastroesophageal reflux disease)   . Kidney stones   . Renal disorder      Patient Active Problem List   Diagnosis Date Noted  . Obesity 07/08/2017  . Acute appendicitis 01/17/2015  .  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 Nuchal fold thickening determined by ultrasound   . Umbilical mass   . [redacted] weeks gestation of pregnancy     Past Surgical History:  Procedure Laterality Date  . BREAST REDUCTION SURGERY    . DILATION AND EVACUATION N/A 01/03/2014   Procedure: DILATATION AND EVACUATION;  Surgeon: Farrel Gobble. Harrington Challenger, MD;  Location: [facility];  Service: Gynecology;  Laterality: N/A;  . LAPAROSCOPIC APPENDECTOMY N/A 01/17/2015   Procedure: APPENDECTOMY LAPAROSCOPIC;  Surgeon: Ralene Ok, MD;  Location: [facility];  Service: General;  Laterality: N/A;    Family History        Family Status  Relation Name Status  . Mother  Alive  . Father  Alive  . Sister  Alive  . MGM  Deceased         Her family history includes Breast cancer (age of onset: 41) in her mother; Diabetes in her maternal grandmother and mother; Healthy in her sister; Heart attack in her maternal grandmother; Hypertension in her father and mother.      No Known Allergies   Current Outpatient Medications:  .  ibuprofen (ADVIL,MOTRIN) 200 MG tablet, You can take 2-3 tablets every 6 hours as needed for pain.  I would use this first, and then the narcotic second., Disp: , Rfl:  .  levonorgestrel (MIRENA) 20 MCG/24HR IUD, 1 each by Intrauterine route once., Disp: , Rfl:    Patient Care Team: Paulene Floor as PCP - General (Physician Assistant)    Objective:    Vitals: BP 108/82 (BP Location: Right Arm, Patient Position: Sitting, Cuff Size: Normal)   Pulse 81   Temp (!) 97.3 F (36.3 C) (Temporal)   Resp 16   Ht 5\' 1"  (1.549 m)   Wt 164 lb 9.6 oz (74.7 kg)   SpO2 99%   BMI 31.10 kg/m    Vitals:   11/06/18 0958  BP: 108/82  Pulse: 81  Resp: 16  Temp: (!) 97.3 F (36.3 C)  TempSrc: Temporal  SpO2: 99%  Weight: 164 lb 9.6 oz (74.7 kg)  Height: 5\' 1"  (1.549 m)     Physical Exam Constitutional:      Appearance: Normal appearance.  HENT:     Right Ear: Tympanic membrane normal.     Left Ear: Tympanic membrane and ear canal normal.  Cardiovascular:     Rate and Rhythm: Normal rate and regular rhythm.     Heart sounds: Normal heart sounds.  Pulmonary:     Effort: Pulmonary effort is normal.     Breath sounds: Normal breath sounds.  Abdominal:     General: Bowel sounds are normal.     Palpations: Abdomen is soft.  Skin:    General: Skin is warm and dry.  Neurological:     Mental Status: She is alert and oriented to person, place, and time. Mental status is at baseline.  Psychiatric:        Mood and Affect: Mood normal.        Behavior: Behavior normal.      Depression Screen PHQ 2/9 Scores 11/06/2018 01/16/2015 12/17/2013  PHQ - 2 Score 0 0 0  PHQ- 9 Score 1 - -        Assessment & Plan:     Routine Health Maintenance and Physical Exam  Exercise Activities and Dietary recommendations Goals   None     Immunization History  Administered Date(s) Administered  .  Influenza,inj,Quad PF,6+ Mos 01/18/2015  . Tdap 07/03/2017    Health Maintenance  Topic Date Due  . PAP SMEAR-Modifier  07/21/2018  . INFLUENZA VACCINE  09/26/2018  . TETANUS/TDAP  07/04/2027  . HIV Screening  Completed     Discussed health benefits of physical activity, and encouraged her to engage in regular exercise appropriate for her age and condition.    1. Annual physical exam  Request PAP records from OBGYN.   - Comprehensive Metabolic Panel (CMET) - CBC with Differential - TSH - Lipid Profile  2. Skin lesion  - Ambulatory referral to Dermatology  3. Class 1 obesity without serious comorbidity with body mass index (BMI) of 31.0 to 31.9 in adult, unspecified obesity type  Was previously on phentermine, has gained weight back since this time. Does not want to restart phentermine at this time due to increased anxiety. Have reviewed additional weight loss medications including Topamax, Qsymia, contrave, orlistat. She will think about it and message back if she wants to do this.   F/u 1 year CPE  --------------------------------------------------------------------    [name], PA-C  [facility]

## 2018-11-06 NOTE — Patient Instructions (Signed)
Health Maintenance, Female Adopting a healthy lifestyle and getting preventive care are important in promoting health and wellness. Ask your health care provider about:  The right schedule for you to have regular tests and exams.  Things you can do on your own to prevent diseases and keep yourself healthy. What should I know about diet, weight, and exercise? Eat a healthy diet   Eat a diet that includes plenty of vegetables, fruits, low-fat dairy products, and lean protein.  Do not eat a lot of foods that are high in solid fats, added sugars, or sodium. Maintain a healthy weight Body mass index (BMI) is used to identify weight problems. It estimates body fat based on height and weight. Your health care provider can help determine your BMI and help you achieve or maintain a healthy weight. Get regular exercise Get regular exercise. This is one of the most important things you can do for your health. Most adults should:  Exercise for at least 150 minutes each week. The exercise should increase your heart rate and make you sweat (moderate-intensity exercise).  Do strengthening exercises at least twice a week. This is in addition to the moderate-intensity exercise.  Spend less time sitting. Even light physical activity can be beneficial. Watch cholesterol and blood lipids Have your blood tested for lipids and cholesterol at 40 years of age, then have this test every 5 years. Have your cholesterol levels checked more often if:  Your lipid or cholesterol levels are high.  You are older than 40 years of age.  You are at high risk for heart disease. What should I know about cancer screening? Depending on your health history and family history, you may need to have cancer screening at various ages. This may include screening for:  Breast cancer.  Cervical cancer.  Colorectal cancer.  Skin cancer.  Lung cancer. What should I know about heart disease, diabetes, and high blood  pressure? Blood pressure and heart disease  High blood pressure causes heart disease and increases the risk of stroke. This is more likely to develop in people who have high blood pressure readings, are of African descent, or are overweight.  Have your blood pressure checked: ? Every 3-5 years if you are 18-39 years of age. ? Every year if you are 40 years old or older. Diabetes Have regular diabetes screenings. This checks your fasting blood sugar level. Have the screening done:  Once every three years after age 40 if you are at a normal weight and have a low risk for diabetes.  More often and at a younger age if you are overweight or have a high risk for diabetes. What should I know about preventing infection? Hepatitis B If you have a higher risk for hepatitis B, you should be screened for this virus. Talk with your health care provider to find out if you are at risk for hepatitis B infection. Hepatitis C Testing is recommended for:  Everyone born from 1945 through 1965.  Anyone with known risk factors for hepatitis C. Sexually transmitted infections (STIs)  Get screened for STIs, including gonorrhea and chlamydia, if: ? You are sexually active and are younger than 40 years of age. ? You are older than 40 years of age and your health care provider tells you that you are at risk for this type of infection. ? Your sexual activity has changed since you were last screened, and you are at increased risk for chlamydia or gonorrhea. Ask your health care provider if   you are at risk.  Ask your health care provider about whether you are at high risk for HIV. Your health care provider may recommend a prescription medicine to help prevent HIV infection. If you choose to take medicine to prevent HIV, you should first get tested for HIV. You should then be tested every 3 months for as long as you are taking the medicine. Pregnancy  If you are about to stop having your period (premenopausal) and  you may become pregnant, seek counseling before you get pregnant.  Take 400 to 800 micrograms (mcg) of folic acid every day if you become pregnant.  Ask for birth control (contraception) if you want to prevent pregnancy. Osteoporosis and menopause Osteoporosis is a disease in which the bones lose minerals and strength with aging. This can result in bone fractures. If you are 65 years old or older, or if you are at risk for osteoporosis and fractures, ask your health care provider if you should:  Be screened for bone loss.  Take a calcium or vitamin D supplement to lower your risk of fractures.  Be given hormone replacement therapy (HRT) to treat symptoms of menopause. Follow these instructions at home: Lifestyle  Do not use any products that contain nicotine or tobacco, such as cigarettes, e-cigarettes, and chewing tobacco. If you need help quitting, ask your health care provider.  Do not use street drugs.  Do not share needles.  Ask your health care provider for help if you need support or information about quitting drugs. Alcohol use  Do not drink alcohol if: ? Your health care provider tells you not to drink. ? You are pregnant, may be pregnant, or are planning to become pregnant.  If you drink alcohol: ? Limit how much you use to 0-1 drink a day. ? Limit intake if you are breastfeeding.  Be aware of how much alcohol is in your drink. In the [place] one drink equals one 12 oz bottle of beer (355 mL), one 5 oz glass of wine (148 mL), or one 1 oz glass of hard liquor (44 mL). General instructions  Schedule regular health, dental, and eye exams.  Stay current with your vaccines.  Tell your health care provider if: ? You often feel depressed. ? You have ever been abused or do not feel safe at home. Summary  Adopting a healthy lifestyle and getting preventive care are important in promoting health and wellness.  Follow your health care provider's instructions about healthy  diet, exercising, and getting tested or screened for diseases.  Follow your health care provider's instructions on monitoring your cholesterol and blood pressure. This information is not intended to replace advice given to you by your health care provider. Make sure you discuss any questions you have with your health care provider. Document Released: 08/27/2010 Document Revised: 02/04/2018 Document Reviewed: 02/04/2018 Elsevier Patient Education  2020 Elsevier Inc.  

## 2018-11-07 LAB — CBC WITH DIFFERENTIAL/PLATELET
Basophils Absolute: 0.1 10*3/uL (ref 0.0–0.2)
Basos: 1 %
EOS (ABSOLUTE): 0 10*3/uL (ref 0.0–0.4)
Eos: 1 %
Hematocrit: 42.4 % (ref 34.0–46.6)
Hemoglobin: 14.3 g/dL (ref 11.1–15.9)
Immature Grans (Abs): 0.1 10*3/uL (ref 0.0–0.1)
Immature Granulocytes: 1 %
Lymphocytes Absolute: 2 10*3/uL (ref 0.7–3.1)
Lymphs: 22 %
MCH: 30.3 pg (ref 26.6–33.0)
MCHC: 33.7 g/dL (ref 31.5–35.7)
MCV: 90 fL (ref 79–97)
Monocytes Absolute: 0.6 10*3/uL (ref 0.1–0.9)
Monocytes: 7 %
Neutrophils Absolute: 6.1 10*3/uL (ref 1.4–7.0)
Neutrophils: 68 %
Platelets: 219 10*3/uL (ref 150–450)
RBC: 4.72 x10E6/uL (ref 3.77–5.28)
RDW: 12.5 % (ref 11.7–15.4)
WBC: 8.9 10*3/uL (ref 3.4–10.8)

## 2018-11-07 LAB — COMPREHENSIVE METABOLIC PANEL
ALT: 12 IU/L (ref 0–32)
AST: 15 IU/L (ref 0–40)
Albumin/Globulin Ratio: 2 (ref 1.2–2.2)
Albumin: 4.5 g/dL (ref 3.8–4.8)
Alkaline Phosphatase: 65 IU/L (ref 39–117)
BUN/Creatinine Ratio: 17 (ref 9–23)
BUN: 12 mg/dL (ref 6–24)
Bilirubin Total: 0.6 mg/dL (ref 0.0–1.2)
CO2: 21 mmol/L (ref 20–29)
Calcium: 9.4 mg/dL (ref 8.7–10.2)
Chloride: 101 mmol/L (ref 96–106)
Creatinine, Ser: 0.71 mg/dL (ref 0.57–1.00)
GFR calc Af Amer: 123 mL/min/{1.73_m2} (ref >59)
GFR calc non Af Amer: 107 mL/min/{1.73_m2} (ref >59)
Globulin, Total: 2.2 g/dL (ref 1.5–4.5)
Glucose: 85 mg/dL (ref 65–99)
Potassium: 4.2 mmol/L (ref 3.5–5.2)
Sodium: 135 mmol/L (ref 134–144)
Total Protein: 6.7 g/dL (ref 6.0–8.5)

## 2018-11-07 LAB — LIPID PANEL
Chol/HDL Ratio: 3.8 ratio (ref 0.0–4.4)
Cholesterol, Total: 173 mg/dL (ref 100–199)
HDL: 45 mg/dL (ref >39)
LDL Chol Calc (NIH): 114 mg/dL — ABNORMAL HIGH (ref 0–99)
Triglycerides: 75 mg/dL (ref 0–149)
VLDL Cholesterol Cal: 14 mg/dL (ref 5–40)

## 2018-11-07 LAB — TSH: TSH: 3.14 u[IU]/mL (ref 0.450–4.500)

## 2018-11-09 ENCOUNTER — Telehealth: Payer: Self-pay

## 2018-11-09 NOTE — Telephone Encounter (Signed)
-----   Message from [name], [place] sent at 11/09/2018  1:32 PM EDT ----- Heather Knight all looks normal.

## 2018-11-09 NOTE — Telephone Encounter (Signed)
Left message advising pt.   Thanks,   -Laura  

## 2019-01-14 MED FILL — traMADol HCL 50 MG TABS: 50 | 2 days supply | Qty: 12 | Fill #0

## 2019-02-16 DIAGNOSIS — Z20828 Contact with and (suspected) exposure to other viral communicable diseases: Secondary | ICD-10-CM | POA: Diagnosis not present

## 2019-03-30 ENCOUNTER — Other Ambulatory Visit: Payer: Self-pay

## 2019-03-30 ENCOUNTER — Ambulatory Visit (INDEPENDENT_AMBULATORY_CARE_PROVIDER_SITE_OTHER)
Admission: RE | Admit: 2019-03-30 | Discharge: 2019-03-30 | Disposition: A | Discharge status: Home / Self Care | Payer: 59 | Source: Ambulatory Visit

## 2019-03-30 DIAGNOSIS — R300 Dysuria: Secondary | ICD-10-CM | POA: Diagnosis not present

## 2019-03-30 MED ORDER — CEPHALEXIN 500 MG PO CAPS: 500.0000 mg | ORAL_CAPSULE | Freq: Three times a day (TID) | ORAL | 0 refills | Status: AC

## 2019-03-30 NOTE — ED Provider Notes (Signed)
Virtual Visit via Video Note:  Heather Knight  initiated request for Telemedicine visit with [facility]gent [facility] team. I connected with Heather Knight  on 03/30/2019 at 5:09 PM  for a synchronized telemedicine visit using a video enabled HIPPA compliant telemedicine application. I verified that I am speaking with Heather Knight  using two identifiers. Sharion Balloon, NP  was physically located in a [facility] Urgent care site and CORINDA RACINE was located at a different location.   The limitations of evaluation and management by telemedicine as well as the availability of in-person appointments were discussed. Patient was informed that she  may incur a bill ( including co-pay) for this virtual visit encounter. Heather Knight  expressed understanding and gave verbal consent to proceed with virtual visit.     History of Present Illness:Heather Knight  is a 39 y.o. female presents for evaluation of 3 day history of urgency, frequency, and dysuria.  She attempted treatment of her symptoms with Azo.  She denies fever, chills, abdominal pain, back pain, vaginal discharge, or pelvic pain.  No other symptoms.  She reports this feels like previous UTIs.  She denies pregnancy or breastfeeding.     No Known Allergies   Past Medical History:  Diagnosis Date  . GERD (gastroesophageal reflux disease)   . Kidney stones   . Renal disorder      Social History   Tobacco Use  . Smoking status: Never Smoker  . Smokeless tobacco: Never Used  Substance Use Topics  . Alcohol use: No  . Drug use: No        Observations/Objective: Physical Exam  VITALS: Patient denies fever. GENERAL: Alert, appears well and in no acute distress. HEENT: Atraumatic. NECK: Normal movements of the head and neck. CARDIOPULMONARY: No increased WOB. Speaking in clear sentences. I:E ratio WNL.  MS: Moves all visible extremities without noticeable abnormality. PSYCH: Pleasant and cooperative, well-groomed. Speech normal rate and  rhythm. Affect is appropriate. Insight and judgement are appropriate. Attention is focused, linear, and appropriate.  NEURO: CN grossly intact. Oriented as arrived to appointment on time with no prompting. Moves both UE equally.  SKIN: No obvious lesions, wounds, erythema, or cyanosis noted on face or hands.   Assessment and Plan:    ICD-10-CM   1. Dysuria  R30.0        Follow Up Instructions: Symptoms are consistent with a UTI.  Treating with Keflex.  Instructed patient to follow-up with her PCP or come here to be seen in person if her symptoms are not improving.  Patient agrees to plan of care.    I discussed the assessment and treatment plan with the patient. The patient was provided an opportunity to ask questions and all were answered. The patient agreed with the plan and demonstrated an understanding of the instructions.   The patient was advised to call back or seek an in-person evaluation if the symptoms worsen or if the condition fails to improve as anticipated.      Sharion Balloon, NP  03/30/2019 5:09 PM         Sharion Balloon, NP 03/30/19 [phone]

## 2019-03-30 NOTE — Discharge Instructions (Signed)
Take the antibiotic as directed.    Follow up with your primary care provider or come here to be seen in person if your symptoms are not improving.    

## 2019-04-12 ENCOUNTER — Other Ambulatory Visit (HOSPITAL_COMMUNITY)
Admission: RE | Admit: 2019-04-12 | Discharge: 2019-04-12 | Disposition: A | Discharge status: Home / Self Care | Payer: 59 | Source: Ambulatory Visit | Attending: Physician Assistant | Admitting: Physician Assistant

## 2019-04-12 ENCOUNTER — Ambulatory Visit (INDEPENDENT_AMBULATORY_CARE_PROVIDER_SITE_OTHER): Payer: 59 | Admitting: Physician Assistant

## 2019-04-12 ENCOUNTER — Other Ambulatory Visit: Payer: Self-pay

## 2019-04-12 ENCOUNTER — Encounter: Payer: Self-pay | Admitting: Physician Assistant

## 2019-04-12 VITALS — BP 116/70 | HR 86 | Temp 96.8°F | Resp 16 | Wt 167.6 lb

## 2019-04-12 DIAGNOSIS — N3090 Cystitis, unspecified without hematuria: Secondary | ICD-10-CM

## 2019-04-12 DIAGNOSIS — R350 Frequency of micturition: Secondary | ICD-10-CM | POA: Diagnosis not present

## 2019-04-12 DIAGNOSIS — B373 Candidiasis of vulva and vagina: Secondary | ICD-10-CM | POA: Insufficient documentation

## 2019-04-12 DIAGNOSIS — N898 Other specified noninflammatory disorders of vagina: Secondary | ICD-10-CM

## 2019-04-12 DIAGNOSIS — N760 Acute vaginitis: Secondary | ICD-10-CM

## 2019-04-12 DIAGNOSIS — B9689 Other specified bacterial agents as the cause of diseases classified elsewhere: Secondary | ICD-10-CM | POA: Insufficient documentation

## 2019-04-12 DIAGNOSIS — R300 Dysuria: Secondary | ICD-10-CM

## 2019-04-12 LAB — POCT URINALYSIS DIPSTICK
Glucose, UA: NEGATIVE
Spec Grav, UA: <=1.005 — AB (ref 1.010–1.025)
pH, UA: 5 (ref 5.0–8.0)

## 2019-04-12 MED ORDER — SULFAMETHOXAZOLE-TRIMETHOPRIM 800-160 MG PO TABS: 1.0000 | ORAL_TABLET | Freq: Two times a day (BID) | ORAL | 0 refills | Status: AC

## 2019-04-12 NOTE — Patient Instructions (Signed)

## 2019-04-12 NOTE — Progress Notes (Addendum)
Patient: Heather Knight Female    DOB: June 14, 1980   39 y.o.   MRN: [id number] Visit Date: 04/12/2019  Today's Provider: [name], PA-C   Chief Complaint  Patient presents with  . Urinary Frequency   Subjective:     Urinary Frequency  This is a new problem. The current episode started 1 to 4 weeks ago. The problem occurs every urination. The problem has been unchanged. There has been no fever. Associated symptoms include chills, frequency and urgency. Pertinent negatives include no discharge, flank pain, hematuria, hesitancy, nausea, possible pregnancy, sweats or vomiting. She has tried antibiotics (Azo, and prescribed Keflex through telehealth) for the symptoms. The treatment provided moderate relief. Her past medical history is significant for kidney stones.   Finished Keflex on Monday 04/05/2019 and felt fine the rest of the week and now is having some recurrent symptoms. Not feeling well this morning. She is also reporting burning with urination again. Having some vagina discharge.   [facility] in [place], [place]   No Known Allergies   Current Outpatient Medications:  .  ibuprofen (ADVIL,MOTRIN) 200 MG tablet, You can take 2-3 tablets every 6 hours as needed for pain.  I would use this first, and then the narcotic second., Disp: , Rfl:  .  levonorgestrel (MIRENA) 20 MCG/24HR IUD, 1 each by Intrauterine route once., Disp: , Rfl:   Review of Systems  Constitutional: Positive for chills.  Gastrointestinal: Negative for nausea and vomiting.  Genitourinary: Positive for frequency and urgency. Negative for flank pain, hematuria and hesitancy.    Social History   Tobacco Use  . Smoking status: Never Smoker  . Smokeless tobacco: Never Used  Substance Use Topics  . Alcohol use: No      Objective:   BP 116/70   Pulse 86   Temp (!) 96.8 F (36 C) (Oral)   Resp 16   Wt 167 lb 9.6 oz (76 kg)   SpO2 96%   BMI 31.67 kg/m  Vitals:   04/12/19 1540  BP: 116/70  Pulse:  86  Resp: 16  Temp: (!) 96.8 F (36 C)  TempSrc: Oral  SpO2: 96%  Weight: 167 lb 9.6 oz (76 kg)  Body mass index is 31.67 kg/m.   Physical Exam   Results for orders placed or performed in visit on 04/12/19  POCT urinalysis dipstick  Result Value Ref Range   Color, UA orange    Clarity, UA clear    Glucose, UA Negative Negative   Bilirubin, UA N/A    Ketones, UA N/A    Spec Grav, UA <=1.005 (A) 1.010 - 1.025   Blood, UA small    pH, UA 5.0 5.0 - 8.0   Protein, UA     Urobilinogen, UA     Nitrite, UA     Leukocytes, UA     Appearance     Odor         Assessment & Plan    1. Urinary frequency  - Urine Culture - POCT urinalysis dipstick - sulfamethoxazole-trimethoprim (BACTRIM DS) 800-160 MG tablet; Take 1 tablet by mouth 2 (two) times daily for 3 days.  Dispense: 6 tablet; Refill: 0 - Cervicovaginal ancillary only  2. Dysuria  - Urine Culture - POCT urinalysis dipstick - Cervicovaginal ancillary only  3. Cystitis  - sulfamethoxazole-trimethoprim (BACTRIM DS) 800-160 MG tablet; Take 1 tablet by mouth 2 (two) times daily for 3 days.  Dispense: 6 tablet; Refill: 0 - Cervicovaginal  ancillary only  4. Vaginal discharge  - Cervicovaginal ancillary only  5. Vaginal odor  - Cervicovaginal ancillary only  6. Acute vaginitis  - Cervicovaginal ancillary only      [name], PA-C  [facility]

## 2019-04-14 ENCOUNTER — Encounter: Payer: Self-pay | Admitting: Physician Assistant

## 2019-04-14 DIAGNOSIS — B9689 Other specified bacterial agents as the cause of diseases classified elsewhere: Secondary | ICD-10-CM

## 2019-04-14 DIAGNOSIS — N3090 Cystitis, unspecified without hematuria: Secondary | ICD-10-CM

## 2019-04-14 DIAGNOSIS — N760 Acute vaginitis: Secondary | ICD-10-CM

## 2019-04-14 DIAGNOSIS — B3731 Acute candidiasis of vulva and vagina: Secondary | ICD-10-CM

## 2019-04-14 DIAGNOSIS — B373 Candidiasis of vulva and vagina: Secondary | ICD-10-CM

## 2019-04-14 LAB — CERVICOVAGINAL ANCILLARY ONLY
Bacterial Vaginitis (gardnerella): POSITIVE — AB
Candida Glabrata: NEGATIVE
Candida Vaginitis: POSITIVE — AB
Chlamydia: NEGATIVE
Comment: NEGATIVE
Comment: NEGATIVE
Comment: NEGATIVE
Comment: NEGATIVE
Comment: NEGATIVE
Comment: NORMAL
Neisseria Gonorrhea: NEGATIVE
Trichomonas: NEGATIVE

## 2019-04-14 LAB — URINE CULTURE

## 2019-04-14 MED ORDER — SULFAMETHOXAZOLE-TRIMETHOPRIM 800-160 MG PO TABS: 1.0000 | ORAL_TABLET | Freq: Two times a day (BID) | ORAL | 0 refills | Status: AC

## 2019-04-15 MED ORDER — METRONIDAZOLE 500 MG PO TABS: 500.0000 mg | ORAL_TABLET | Freq: Two times a day (BID) | ORAL | 0 refills | Status: AC

## 2019-04-15 MED ORDER — FLUCONAZOLE 150 MG PO TABS: ORAL_TABLET | ORAL | 0 refills | Status: DC

## 2019-04-15 NOTE — Addendum Note (Signed)
Addended by: [name] on: 04/15/2019 09:00 AM   Modules accepted: Orders

## 2019-04-20 ENCOUNTER — Other Ambulatory Visit: Payer: Self-pay | Admitting: Obstetrics and Gynecology

## 2019-04-20 DIAGNOSIS — Z1231 Encounter for screening mammogram for malignant neoplasm of breast: Secondary | ICD-10-CM

## 2019-04-28 ENCOUNTER — Encounter: Payer: Self-pay | Admitting: Physician Assistant

## 2019-05-14 ENCOUNTER — Encounter: Payer: Self-pay | Admitting: Physician Assistant

## 2019-05-21 ENCOUNTER — Other Ambulatory Visit: Payer: Self-pay

## 2019-05-21 ENCOUNTER — Ambulatory Visit
Admission: RE | Admit: 2019-05-21 | Discharge: 2019-05-21 | Disposition: A | Discharge status: Home / Self Care | Payer: 59 | Source: Ambulatory Visit | Attending: Obstetrics and Gynecology | Admitting: Obstetrics and Gynecology

## 2019-05-21 DIAGNOSIS — Z1231 Encounter for screening mammogram for malignant neoplasm of breast: Secondary | ICD-10-CM

## 2019-06-11 DIAGNOSIS — Z01419 Encounter for gynecological examination (general) (routine) without abnormal findings: Secondary | ICD-10-CM | POA: Diagnosis not present

## 2019-06-11 DIAGNOSIS — R3915 Urgency of urination: Secondary | ICD-10-CM | POA: Diagnosis not present

## 2019-06-11 MED FILL — FLUCONAZOLE 150 MG TABLET: 150 | 2 days supply | Qty: 2 | Fill #0

## 2019-07-01 ENCOUNTER — Encounter: Payer: Self-pay | Admitting: Physician Assistant

## 2019-07-02 NOTE — Telephone Encounter (Signed)
Heather Knight,    Can you resent the billing to [place]?    Thanks,   -Mickel Baas

## 2019-10-15 ENCOUNTER — Telehealth: Payer: Self-pay

## 2019-10-15 NOTE — Telephone Encounter (Signed)
I really don't know how to solve it. We have been over this, her insurance deemed the codes "experimental" which I don't understand. I've done everything You can pass it on to practice management.

## 2019-10-15 NOTE — Telephone Encounter (Signed)
Please review. Thanks!  

## 2019-10-15 NOTE — Telephone Encounter (Signed)
Sarah, please review. Thanks!  

## 2019-10-15 NOTE — Telephone Encounter (Signed)
Copied from [place] [id number]. Topic: Complaint - Billing/Coding >> Oct 15, 2019 10:43 AM Mathis Bud wrote: DOS: 04/12/19 Details of complaint: Patient states that she had a UTI and states that it was coding wrong.  Patient already messaged PCP on mychart and PCP let patient know she changed the codes but patient is still getting same bill.  How would the patient like to see this issue resolved? To get bills removed   Patient call back [phone]

## 2020-05-31 ENCOUNTER — Other Ambulatory Visit: Payer: Self-pay | Admitting: Obstetrics and Gynecology

## 2020-05-31 DIAGNOSIS — Z1231 Encounter for screening mammogram for malignant neoplasm of breast: Secondary | ICD-10-CM

## 2020-07-06 DIAGNOSIS — Z01419 Encounter for gynecological examination (general) (routine) without abnormal findings: Secondary | ICD-10-CM | POA: Diagnosis not present

## 2020-08-03 ENCOUNTER — Ambulatory Visit: Payer: 59

## 2020-08-18 ENCOUNTER — Other Ambulatory Visit: Payer: Self-pay

## 2020-08-18 ENCOUNTER — Ambulatory Visit
Admission: RE | Admit: 2020-08-18 | Discharge: 2020-08-18 | Disposition: A | Discharge status: Home / Self Care | Payer: 59 | Source: Ambulatory Visit | Attending: Obstetrics and Gynecology | Admitting: Obstetrics and Gynecology

## 2020-08-18 DIAGNOSIS — Z1231 Encounter for screening mammogram for malignant neoplasm of breast: Secondary | ICD-10-CM

## 2021-01-23 ENCOUNTER — Telehealth: Payer: 59 | Admitting: Family Medicine

## 2021-01-23 ENCOUNTER — Telehealth: Payer: 59

## 2021-01-23 DIAGNOSIS — R300 Dysuria: Secondary | ICD-10-CM

## 2021-01-23 MED ORDER — NITROFURANTOIN MONOHYD MACRO 100 MG PO CAPS: 100.0000 mg | ORAL_CAPSULE | Freq: Two times a day (BID) | ORAL | 0 refills | Status: AC

## 2021-01-23 NOTE — Progress Notes (Signed)
Virtual Visit Consent   Heather Knight, you are scheduled for a virtual visit with a [facility] provider today.     Just as with appointments in the office, your consent must be obtained to participate.  Your consent will be active for this visit and any virtual visit you may have with one of our providers in the next 365 days.     If you have a MyChart account, a copy of this consent can be sent to you electronically.  All virtual visits are billed to your insurance company just like a traditional visit in the office.    As this is a virtual visit, video technology does not allow for your provider to perform a traditional examination.  This may limit your provider's ability to fully assess your condition.  If your provider identifies any concerns that need to be evaluated in person or the need to arrange testing (such as labs, EKG, etc.), we will make arrangements to do so.     Although advances in technology are sophisticated, we cannot ensure that it will always work on either your end or our end.  If the connection with a video visit is poor, the visit may have to be switched to a telephone visit.  With either a video or telephone visit, we are not always able to ensure that we have a secure connection.     I need to obtain your verbal consent now.   Are you willing to proceed with your visit today?    Heather Knight has provided verbal consent on 01/23/2021 for a virtual visit (video or telephone).   Perlie Mayo, NP   Date: 01/23/2021 9:01 AM   Virtual Visit via Video Note   I, Perlie Mayo, connected with  Heather Knight  ([id number], 07-24-1980) on 01/23/21 at  9:10 AM EST by a video-enabled telemedicine application and verified that I am speaking with the correct person using two identifiers.  Location: Patient: Virtual Visit Location Patient: Home Provider: Virtual Visit Location Provider: Home Office   I discussed the limitations of evaluation and management by telemedicine and  the availability of in person appointments. The patient expressed understanding and agreed to proceed.    History of Present Illness: Heather Knight is a 40 y.o. who identifies as a female who was assigned female at birth, and is being seen today for UTI symptoms.  HPI: Urinary Tract Infection  This is a new problem. The current episode started in the past 7 days. The problem occurs every urination. The problem has been gradually worsening. The quality of the pain is described as burning. There has been no fever. She is Sexually active. There is No history of pyelonephritis. Associated symptoms include frequency and urgency. Pertinent negatives include no chills, flank pain, hematuria, nausea, possible pregnancy or vomiting. Treatments tried: AZO OTC. The treatment provided mild relief. Her past medical history is significant for kidney stones.   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 Take second pill three days later if symptoms persist., Disp: 2 tablet,  Rfl: 0   ibuprofen (ADVIL,MOTRIN) 200 MG tablet, You can take 2-3 tablets every 6 hours as needed for pain.  I would use this first, and then the narcotic second., Disp: , Rfl:    levonorgestrel (MIRENA) 20 MCG/24HR IUD, 1 each by Intrauterine route once., Disp: , Rfl:   Observations/Objective: Patient is well-developed, well-nourished in no acute distress.  Resting comfortably  at home.  Head is normocephalic, atraumatic.  No labored breathing.  Speech is clear and coherent with logical content.  Patient is alert and oriented at  baseline.    Assessment and Plan: 1. Dysuria S&S classic for UTI- will treat with Macrobid No pregnant or breastfeeding  - nitrofurantoin, macrocrystal-monohydrate, (MACROBID) 100 MG capsule; Take 1 capsule (100 mg total) by mouth 2 (two) times daily for 5 days.  Dispense: 10 capsule; Refill: 0   Reviewed side effects, risks and benefits of medication.    Patient acknowledged agreement and understanding of the plan.   I discussed the assessment and treatment plan with the patient. The patient was provided an opportunity to ask questions and all were answered. The patient agreed with the plan and demonstrated an understanding of the instructions.   The patient was advised to call back or seek an in-person evaluation if the symptoms worsen or if the condition fails to improve as anticipated.   The above assessment and management plan was discussed with the patient. The patient verbalized understanding of and has agreed to the management plan. Patient is aware to call the clinic if symptoms persist or worsen. Patient is aware when to return to the clinic for a follow-up visit. Patient educated on when it is appropriate to go to the emergency department.    Follow Up Instructions: I discussed the assessment and treatment plan with the patient. The patient was provided an opportunity to ask questions and all were answered. The patient agreed with the plan and demonstrated an understanding of the instructions.  A copy of instructions were sent to the patient via MyChart unless otherwise noted below.    The patient was advised to call back or seek an in-person evaluation if the symptoms worsen or if the condition fails to improve as anticipated.  Time:  I spent 10 minutes with the patient via telehealth technology discussing the above problems/concerns.    Perlie Mayo, NP

## 2021-01-23 NOTE — Patient Instructions (Signed)
I appreciate the opportunity to provide you with care for your health and wellness.  Take medication in full.  Please continue to practice social distancing to keep you, your family, and our community safe.  If you must go out, please wear a mask and practice good handwashing.  Have a wonderful day. With Gratitude, Cherly Beach, DNP, AGNP-BC  Urinary Tract Infection, Adult A urinary tract infection (UTI) is an infection of any part of the urinary tract. The urinary tract includes: The kidneys. The ureters. The bladder. The urethra. These organs make, store, and get rid of pee (urine) in the body. What are the causes? This infection is caused by germs (bacteria) in your genital area. These germs grow and cause swelling (inflammation) of your urinary tract. What increases the risk? The following factors may make you more likely to develop this condition: Using a small, thin tube (catheter) to drain pee. Not being able to control when you pee or poop (incontinence). Being female. If you are female, these things can increase the risk: Using these methods to prevent pregnancy: A medicine that kills sperm (spermicide). A device that blocks sperm (diaphragm). Having low levels of a female hormone (estrogen). Being pregnant. You are more likely to develop this condition if: You have genes that add to your risk. You are sexually active. You take antibiotic medicines. You have trouble peeing because of: A prostate that is bigger than normal, if you are female. A blockage in the part of your body that drains pee from the bladder. A kidney stone. A nerve condition that affects your bladder. Not getting enough to drink. Not peeing often enough. You have other conditions, such as: Diabetes. A weak disease-fighting system (immune system). Sickle cell disease. Gout. Injury of the spine. What are the signs or symptoms? Symptoms of this condition include: Needing to pee right  away. Peeing small amounts often. Pain or burning when peeing. Blood in the pee. Pee that smells bad or not like normal. Trouble peeing. Pee that is cloudy. Fluid coming from the vagina, if you are female. Pain in the belly or lower back. Other symptoms include: Vomiting. Not feeling hungry. Feeling mixed up (confused). This may be the first symptom in older adults. Being tired and grouchy (irritable). A fever. Watery poop (diarrhea). How is this treated? Taking antibiotic medicine. Taking other medicines. Drinking enough water. In some cases, you may need to see a specialist. Follow these instructions at home: Medicines Take over-the-counter and prescription medicines only as told by your doctor. If you were prescribed an antibiotic medicine, take it as told by your doctor. Do not stop taking it even if you start to feel better. General instructions Make sure you: Pee until your bladder is empty. Do not hold pee for a long time. Empty your bladder after sex. Wipe from front to back after peeing or pooping if you are a female. Use each tissue one time when you wipe. Drink enough fluid to keep your pee pale yellow. Keep all follow-up visits. Contact a doctor if: You do not get better after 1-2 days. Your symptoms go away and then come back. Get help right away if: You have very bad back pain. You have very bad pain in your lower belly. You have a fever. You have chills. You feeling like you will vomit or you vomit. Summary A urinary tract infection (UTI) is an infection of any part of the urinary tract. This condition is caused by germs in your genital area.  There are many risk factors for a UTI. Treatment includes antibiotic medicines. Drink enough fluid to keep your pee pale yellow. This information is not intended to replace advice given to you by your health care provider. Make sure you discuss any questions you have with your health care provider. Document  Revised: 09/24/2019 Document Reviewed: 09/24/2019 Elsevier Patient Education  [place].

## 2021-06-14 ENCOUNTER — Ambulatory Visit (INDEPENDENT_AMBULATORY_CARE_PROVIDER_SITE_OTHER): Payer: 59 | Admitting: Physician Assistant

## 2021-06-14 ENCOUNTER — Encounter: Payer: Self-pay | Admitting: Physician Assistant

## 2021-06-14 VITALS — BP 100/65 | HR 86 | Temp 98.0°F | Ht 60.0 in | Wt 162.0 lb

## 2021-06-14 DIAGNOSIS — E669 Obesity, unspecified: Secondary | ICD-10-CM

## 2021-06-14 DIAGNOSIS — Z6831 Body mass index (BMI) 31.0-31.9, adult: Secondary | ICD-10-CM

## 2021-06-14 DIAGNOSIS — Z7689 Persons encountering health services in other specified circumstances: Secondary | ICD-10-CM

## 2021-06-14 NOTE — Progress Notes (Signed)
? ?New Patient Office Visit ? ?Subjective   ? ?Patient ID: Heather Knight, female    DOB: [birth year]  Age: 41 y.o. MRN: [id number] ? ?CC:  ?Chief Complaint  ?Patient presents with  ? New Patient (Initial Visit)  ? ? ?HPI ?Heather Knight presents to establish care. Patient has a past medical hx of kidney stones. Patient does not take medications or supplements. Reports has on OB/GYN (Dr. Vanessa Kick) who manages Mirena. Surgeries include appendectomy and D&C. Family history is pertinent for diabetes mellitus, hypertension and breast cancer. Patient has never been a smoker and no alcohol use. Patient reports currently started Weight Watcher's again to help with weight  loss.  ? ? ?Outpatient Encounter Medications as of 06/14/2021  ?Medication Sig  ? levonorgestrel (MIRENA) 20 MCG/24HR IUD 1 each by Intrauterine route once.  ? [DISCONTINUED] fluconazole (DIFLUCAN) 150 MG tablet Take 1 pill the first day. Take second pill three days later if symptoms persist.  ? [DISCONTINUED] ibuprofen (ADVIL,MOTRIN) 200 MG tablet You can take 2-3 tablets every 6 hours as needed for pain.  I would use this first, and then the narcotic second.  ? ?No facility-administered encounter medications on file as of 06/14/2021.  ? ? ?Past Medical History:  ?Diagnosis Date  ? GERD (gastroesophageal reflux disease)   ? Kidney stones   ? Renal disorder   ? ? ?Past Surgical History:  ?Procedure Laterality Date  ? BREAST BIOPSY Left 01/29/2018  ? [facility]  ? BREAST BIOPSY Right 05/2016  ? BREAST REDUCTION SURGERY    ? DILATION AND EVACUATION N/A 01/03/2014  ? Procedure: DILATATION AND EVACUATION;  Surgeon: Farrel Gobble. Harrington Challenger, MD;  Location: [facility];  Service: Gynecology;  Laterality: N/A;  ? LAPAROSCOPIC APPENDECTOMY N/A 01/17/2015  ? Procedure: APPENDECTOMY LAPAROSCOPIC;  Surgeon: Ralene Ok, MD;  Location: [facility];  Service: General;  Laterality: N/A;  ? ? ?Family History  ?Problem Relation Age of Onset  ? Breast cancer Mother 42  ? Diabetes Mother   ?  Hypertension Mother   ? Hypertension Father   ? Healthy Sister   ? Heart attack Maternal Grandmother   ? Diabetes Maternal Grandmother   ? ? ?Social History  ? ?Socioeconomic History  ? Marital status: Married  ?  Spouse name: Sharea Guinther  ? Number of children: 2  ? Years of education: Not on file  ? Highest education level: Not on file  ?Occupational History  ? Not on file  ?Tobacco Use  ? Smoking status: Never  ? Smokeless tobacco: Never  ?Vaping Use  ? Vaping Use: Never used  ?Substance and Sexual Activity  ? Alcohol use: No  ? Drug use: No  ? Sexual activity: Yes  ?  Birth control/protection: I.U.D.  ?Other Topics Concern  ? Not on file  ?Social History Narrative  ? Not on file  ? ?Social Determinants of Health  ? ?Financial Resource Strain: Not on file  ?Food Insecurity: Not on file  ?Transportation Needs: Not on file  ?Physical Activity: Not on file  ?Stress: Not on file  ?Social Connections: Not on file  ?Intimate Partner Violence: Not on file  ? ? ?ROS ?Review of Systems:  ?A fourteen system review of systems was performed and found to be positive as per HPI. ? ?  ? ? ?Objective   ? ?BP 100/65   Pulse 86   Temp 98 ?F (36.7 ?C)   Ht 5' (1.524 m)   Wt 162 lb (73.5 kg)  SpO2 98%   Breastfeeding No   BMI 31.64 kg/m?  ? ?Physical Exam ?General:  Well Developed, well nourished, appropriate for stated age.  ?Neuro:  Alert and oriented,  extra-ocular muscles intact  ?HEENT:  Normocephalic, atraumatic, neck supple  ?Skin:  no gross rash, warm, pink. ?Cardiac:  RRR, S1 S2 ?Respiratory: CTA B/L  ?Vascular:  Ext warm, no cyanosis apprec.; cap RF less 2 sec. ?Psych:  No HI/SI, judgement and insight good, Euthymic mood. Full Affect. ? ? ? ? ?Assessment & Plan:  ? ?Problem List Items Addressed This Visit   ?None ?Visit Diagnoses   ? ? Encounter to establish care    -  Primary  ? Class 1 obesity with body mass index (BMI) of 31.0 to 31.9 in adult, unspecified obesity type, unspecified whether serious comorbidity  present      ? ?  ? ?Encounter to establish care: ?-Reviewed records in Epic. ?-Recommend to schedule CPE and FBW. ?-UTD mammogram. Will request OB/GYN records. ? ?Class 1 obesity with body mass index (BMI) of 31.0 to 31.9 in adult, unspecified obesity type, unspecified whether serious comorbidity present: ?-Encourage to continue weight loss efforts, continue Weight Watcher's. Discussed with patient additional treatment options such as medication therapy [facility]) we can consider in conjunction with diet changes if struggling with weight. Pt verbalized understanding.  ? ? ?Return in about 3 months (around 09/13/2021) for CPE and FBW.  ? ?Lorrene Reid, PA-C ? ? ?

## 2021-06-14 NOTE — Patient Instructions (Signed)
Calorie Counting for Weight Loss ?Calories are units of energy. Your body needs a certain number of calories from food to keep going throughout the day. When you eat or drink more calories than your body needs, your body stores the extra calories mostly as fat. When you eat or drink fewer calories than your body needs, your body burns fat to get the energy it needs. ?Calorie counting means keeping track of how many calories you eat and drink each day. Calorie counting can be helpful if you need to lose weight. If you eat fewer calories than your body needs, you should lose weight. Ask your health care provider what a healthy weight is for you. ?For calorie counting to work, you will need to eat the right number of calories each day to lose a healthy amount of weight per week. A dietitian can help you figure out how many calories you need in a day and will suggest ways to reach your calorie goal. ?A healthy amount of weight to lose each week is usually 1-2 lb (0.5-0.9 kg). This usually means that your daily calorie intake should be reduced by 500-750 calories. ?Eating 1,200-1,500 calories a day can help most women lose weight. ?Eating 1,500-1,800 calories a day can help most men lose weight. ?What do I need to know about calorie counting? ?Work with your health care provider or dietitian to determine how many calories you should get each day. To meet your daily calorie goal, you will need to: ?Find out how many calories are in each food that you would like to eat. Try to do this before you eat. ?Decide how much of the food you plan to eat. ?Keep a food log. Do this by writing down what you ate and how many calories it had. ?To successfully lose weight, it is important to balance calorie counting with a healthy lifestyle that includes regular activity. ?Where do I find calorie information? ? ?The number of calories in a food can be found on a Nutrition Facts label. If a food does not have a Nutrition Facts label, try  to look up the calories online or ask your dietitian for help. ?Remember that calories are listed per serving. If you choose to have more than one serving of a food, you will have to multiply the calories per serving by the number of servings you plan to eat. For example, the label on a package of bread might say that a serving size is 1 slice and that there are 90 calories in a serving. If you eat 1 slice, you will have eaten 90 calories. If you eat 2 slices, you will have eaten 180 calories. ?How do I keep a food log? ?After each time that you eat, record the following in your food log as soon as possible: ?What you ate. Be sure to include toppings, sauces, and other extras on the food. ?How much you ate. This can be measured in cups, ounces, or number of items. ?How many calories were in each food and drink. ?The total number of calories in the food you ate. ?Keep your food log near you, such as in a pocket-sized notebook or on an app or website on your mobile phone. Some programs will calculate calories for you and show you how many calories you have left to meet your daily goal. ?What are some portion-control tips? ?Know how many calories are in a serving. This will help you know how many servings you can have of a certain  food. ?Use a measuring cup to measure serving sizes. You could also try weighing out portions on a kitchen scale. With time, you will be able to estimate serving sizes for some foods. ?Take time to put servings of different foods on your favorite plates or in your favorite bowls and cups so you know what a serving looks like. ?Try not to eat straight from a food's packaging, such as from a bag or box. Eating straight from the package makes it hard to see how much you are eating and can lead to overeating. Put the amount you would like to eat in a cup or on a plate to make sure you are eating the right portion. ?Use smaller plates, glasses, and bowls for smaller portions and to prevent  overeating. ?Try not to multitask. For example, avoid watching TV or using your computer while eating. If it is time to eat, sit down at a table and enjoy your food. This will help you recognize when you are full. It will also help you be more mindful of what and how much you are eating. ?What are tips for following this plan? ?Reading food labels ?Check the calorie count compared with the serving size. The serving size may be smaller than what you are used to eating. ?Check the source of the calories. Try to choose foods that are high in protein, fiber, and vitamins, and low in saturated fat, trans fat, and sodium. ?Shopping ?Read nutrition labels while you shop. This will help you make healthy decisions about which foods to buy. ?Pay attention to nutrition labels for low-fat or fat-free foods. These foods sometimes have the same number of calories or more calories than the full-fat versions. They also often have added sugar, starch, or salt to make up for flavor that was removed with the fat. ?Make a grocery list of lower-calorie foods and stick to it. ?Cooking ?Try to cook your favorite foods in a healthier way. For example, try baking instead of frying. ?Use low-fat dairy products. ?Meal planning ?Use more fruits and vegetables. One-half of your plate should be fruits and vegetables. ?Include lean proteins, such as chicken, [place], and fish. ?Lifestyle ?Each week, aim to do one of the following: ?150 minutes of moderate exercise, such as walking. ?75 minutes of vigorous exercise, such as running. ?General information ?Know how many calories are in the foods you eat most often. This will help you calculate calorie counts faster. ?Find a way of tracking calories that works for you. Get creative. Try different apps or programs if writing down calories does not work for you. ?What foods should I eat? ? ?Eat nutritious foods. It is better to have a nutritious, high-calorie food, such as an avocado, than a food with  few nutrients, such as a bag of potato chips. ?Use your calories on foods and drinks that will fill you up and will not leave you hungry soon after eating. ?Examples of foods that fill you up are nuts and nut butters, vegetables, lean proteins, and high-fiber foods such as whole grains. High-fiber foods are foods with more than 5 g of fiber per serving. ?Pay attention to calories in drinks. Low-calorie drinks include water and unsweetened drinks. ?The items listed above may not be a complete list of foods and beverages you can eat. Contact a dietitian for more information. ?What foods should I limit? ?Limit foods or drinks that are not good sources of vitamins, minerals, or protein or that are high in unhealthy fats. These  include: ?Candy. ?Other sweets. ?Sodas, specialty coffee drinks, alcohol, and juice. ?The items listed above may not be a complete list of foods and beverages you should avoid. Contact a dietitian for more information. ?How do I count calories when eating out? ?Pay attention to portions. Often, portions are much larger when eating out. Try these tips to keep portions smaller: ?Consider sharing a meal instead of getting your own. ?If you get your own meal, eat only half of it. Before you start eating, ask for a container and put half of your meal into it. ?When available, consider ordering smaller portions from the menu instead of full portions. ?Pay attention to your food and drink choices. Knowing the way food is cooked and what is included with the meal can help you eat fewer calories. ?If calories are listed on the menu, choose the lower-calorie options. ?Choose dishes that include vegetables, fruits, whole grains, low-fat dairy products, and lean proteins. ?Choose items that are boiled, broiled, grilled, or steamed. Avoid items that are buttered, battered, fried, or served with cream sauce. Items labeled as crispy are usually fried, unless stated otherwise. ?Choose water, low-fat milk,  unsweetened iced tea, or other drinks without added sugar. If you want an alcoholic beverage, choose a lower-calorie option, such as a glass of wine or light beer. ?Ask for dressings, sauces, and syrups on the side.

## 2021-07-19 DIAGNOSIS — D2262 Melanocytic nevi of left upper limb, including shoulder: Secondary | ICD-10-CM | POA: Diagnosis not present

## 2021-07-19 DIAGNOSIS — Z419 Encounter for procedure for purposes other than remedying health state, unspecified: Secondary | ICD-10-CM | POA: Diagnosis not present

## 2021-07-19 DIAGNOSIS — D225 Melanocytic nevi of trunk: Secondary | ICD-10-CM | POA: Diagnosis not present

## 2021-07-19 DIAGNOSIS — D1801 Hemangioma of skin and subcutaneous tissue: Secondary | ICD-10-CM | POA: Diagnosis not present

## 2021-07-19 DIAGNOSIS — D2272 Melanocytic nevi of left lower limb, including hip: Secondary | ICD-10-CM | POA: Diagnosis not present

## 2021-07-19 DIAGNOSIS — L814 Other melanin hyperpigmentation: Secondary | ICD-10-CM | POA: Diagnosis not present

## 2021-07-19 DIAGNOSIS — L858 Other specified epidermal thickening: Secondary | ICD-10-CM | POA: Diagnosis not present

## 2021-08-10 NOTE — Patient Instructions (Incomplete)
Preventive Care 40-43 Years Old, Female Preventive care refers to lifestyle choices and visits with your health care provider that can promote health and wellness. Preventive care visits are also called wellness exams. What can I expect for my preventive care visit? Counseling Your health care provider may ask you questions about your: Medical history, including: Past medical problems. Family medical history. Pregnancy history. Current health, including: Menstrual cycle. Method of birth control. Emotional well-being. Home life and relationship well-being. Sexual activity and sexual health. Lifestyle, including: Alcohol, nicotine or tobacco, and drug use. Access to firearms. Diet, exercise, and sleep habits. Work and work environment. Sunscreen use. Safety issues such as seatbelt and bike helmet use. Physical exam Your health care provider will check your: Height and weight. These may be used to calculate your BMI (body mass index). BMI is a measurement that tells if you are at a healthy weight. Waist circumference. This measures the distance around your waistline. This measurement also tells if you are at a healthy weight and may help predict your risk of certain diseases, such as type 2 diabetes and high blood pressure. Heart rate and blood pressure. Body temperature. Skin for abnormal spots. What immunizations do I need?  Vaccines are usually given at various ages, according to a schedule. Your health care provider will recommend vaccines for you based on your age, medical history, and lifestyle or other factors, such as travel or where you work. What tests do I need? Screening Your health care provider may recommend screening tests for certain conditions. This may include: Lipid and cholesterol levels. Diabetes screening. This is done by checking your blood sugar (glucose) after you have not eaten for a while (fasting). Pelvic exam and Pap test. Hepatitis B test. Hepatitis C  test. HIV (human immunodeficiency virus) test. STI (sexually transmitted infection) testing, if you are at risk. Lung cancer screening. Colorectal cancer screening. Mammogram. Talk with your health care provider about when you should start having regular mammograms. This may depend on whether you have a family history of breast cancer. BRCA-related cancer screening. This may be done if you have a family history of breast, ovarian, tubal, or peritoneal cancers. Bone density scan. This is done to screen for osteoporosis. Talk with your health care provider about your test results, treatment options, and if necessary, the need for more tests. Follow these instructions at home: Eating and drinking  Eat a diet that includes fresh fruits and vegetables, whole grains, lean protein, and low-fat dairy products. Take vitamin and mineral supplements as recommended by your health care provider. Do not drink alcohol if: Your health care provider tells you not to drink. You are pregnant, may be pregnant, or are planning to become pregnant. If you drink alcohol: Limit how much you have to 0-1 drink a day. Know how much alcohol is in your drink. In the [place] one drink equals one 12 oz bottle of beer (355 mL), one 5 oz glass of wine (148 mL), or one 1 oz glass of hard liquor (44 mL). Lifestyle Brush your teeth every morning and night with fluoride toothpaste. Floss one time each day. Exercise for at least 30 minutes 5 or more days each week. Do not use any products that contain nicotine or tobacco. These products include cigarettes, chewing tobacco, and vaping devices, such as e-cigarettes. If you need help quitting, ask your health care provider. Do not use drugs. If you are sexually active, practice safe sex. Use a condom or other form of protection to   prevent STIs. If you do not wish to become pregnant, use a form of birth control. If you plan to become pregnant, see your health care provider for a  prepregnancy visit. Take aspirin only as told by your health care provider. Make sure that you understand how much to take and what form to take. Work with your health care provider to find out whether it is safe and beneficial for you to take aspirin daily. Find healthy ways to manage stress, such as: Meditation, yoga, or listening to music. Journaling. Talking to a trusted person. Spending time with friends and family. Minimize exposure to UV radiation to reduce your risk of skin cancer. Safety Always wear your seat belt while driving or riding in a vehicle. Do not drive: If you have been drinking alcohol. Do not ride with someone who has been drinking. When you are tired or distracted. While texting. If you have been using any mind-altering substances or drugs. Wear a helmet and other protective equipment during sports activities. If you have firearms in your house, make sure you follow all gun safety procedures. Seek help if you have been physically or sexually abused. What's next? Visit your health care provider once a year for an annual wellness visit. Ask your health care provider how often you should have your eyes and teeth checked. Stay up to date on all vaccines. This information is not intended to replace advice given to you by your health care provider. Make sure you discuss any questions you have with your health care provider. Document Revised: 08/09/2020 Document Reviewed: 08/09/2020 Elsevier Patient Education  [place].

## 2021-08-14 ENCOUNTER — Other Ambulatory Visit: Payer: Self-pay | Admitting: Obstetrics and Gynecology

## 2021-08-14 DIAGNOSIS — Z1231 Encounter for screening mammogram for malignant neoplasm of breast: Secondary | ICD-10-CM

## 2021-08-17 ENCOUNTER — Encounter: Payer: Self-pay | Admitting: Physician Assistant

## 2021-08-17 ENCOUNTER — Ambulatory Visit (INDEPENDENT_AMBULATORY_CARE_PROVIDER_SITE_OTHER): Payer: 59 | Admitting: Physician Assistant

## 2021-08-17 VITALS — BP 92/63 | HR 77 | Temp 97.8°F | Ht 60.0 in | Wt 155.4 lb

## 2021-08-17 DIAGNOSIS — R002 Palpitations: Secondary | ICD-10-CM | POA: Diagnosis not present

## 2021-08-17 DIAGNOSIS — R110 Nausea: Secondary | ICD-10-CM | POA: Diagnosis not present

## 2021-08-17 DIAGNOSIS — F439 Reaction to severe stress, unspecified: Secondary | ICD-10-CM | POA: Diagnosis not present

## 2021-08-17 DIAGNOSIS — Z0000 Encounter for general adult medical examination without abnormal findings: Secondary | ICD-10-CM | POA: Diagnosis not present

## 2021-08-18 LAB — COMPREHENSIVE METABOLIC PANEL
ALT: 11 IU/L (ref 0–32)
AST: 14 IU/L (ref 0–40)
Albumin/Globulin Ratio: 2 (ref 1.2–2.2)
Albumin: 4.5 g/dL (ref 3.8–4.8)
Alkaline Phosphatase: 54 IU/L (ref 44–121)
BUN/Creatinine Ratio: 14 (ref 9–23)
BUN: 11 mg/dL (ref 6–24)
Bilirubin Total: 0.8 mg/dL (ref 0.0–1.2)
CO2: 21 mmol/L (ref 20–29)
Calcium: 9.2 mg/dL (ref 8.7–10.2)
Chloride: 103 mmol/L (ref 96–106)
Creatinine, Ser: 0.78 mg/dL (ref 0.57–1.00)
Globulin, Total: 2.2 g/dL (ref 1.5–4.5)
Glucose: 84 mg/dL (ref 70–99)
Potassium: 4.5 mmol/L (ref 3.5–5.2)
Sodium: 139 mmol/L (ref 134–144)
Total Protein: 6.7 g/dL (ref 6.0–8.5)
eGFR: 97 mL/min/{1.73_m2} (ref >59)

## 2021-08-18 LAB — CBC WITH DIFFERENTIAL/PLATELET
Basophils Absolute: 0 10*3/uL (ref 0.0–0.2)
Basos: 1 %
EOS (ABSOLUTE): 0.1 10*3/uL (ref 0.0–0.4)
Eos: 2 %
Hematocrit: 41.9 % (ref 34.0–46.6)
Hemoglobin: 14.2 g/dL (ref 11.1–15.9)
Immature Grans (Abs): 0 10*3/uL (ref 0.0–0.1)
Immature Granulocytes: 0 %
Lymphocytes Absolute: 1.6 10*3/uL (ref 0.7–3.1)
Lymphs: 25 %
MCH: 30.9 pg (ref 26.6–33.0)
MCHC: 33.9 g/dL (ref 31.5–35.7)
MCV: 91 fL (ref 79–97)
Monocytes Absolute: 0.5 10*3/uL (ref 0.1–0.9)
Monocytes: 9 %
Neutrophils Absolute: 4 10*3/uL (ref 1.4–7.0)
Neutrophils: 63 %
Platelets: 197 10*3/uL (ref 150–450)
RBC: 4.59 x10E6/uL (ref 3.77–5.28)
RDW: 12.5 % (ref 11.7–15.4)
WBC: 6.3 10*3/uL (ref 3.4–10.8)

## 2021-08-18 LAB — TSH: TSH: 2.98 u[IU]/mL (ref 0.450–4.500)

## 2021-08-18 LAB — LIPID PANEL
Chol/HDL Ratio: 4.2 ratio (ref 0.0–4.4)
Cholesterol, Total: 187 mg/dL (ref 100–199)
HDL: 45 mg/dL (ref >39)
LDL Chol Calc (NIH): 130 mg/dL — ABNORMAL HIGH (ref 0–99)
Triglycerides: 66 mg/dL (ref 0–149)
VLDL Cholesterol Cal: 12 mg/dL (ref 5–40)

## 2021-08-18 LAB — HEMOGLOBIN A1C
Est. average glucose Bld gHb Est-mCnc: 97 mg/dL
Hgb A1c MFr Bld: 5 % (ref 4.8–5.6)

## 2021-10-04 ENCOUNTER — Ambulatory Visit: Payer: 59

## 2021-10-18 ENCOUNTER — Ambulatory Visit
Admission: RE | Admit: 2021-10-18 | Discharge: 2021-10-18 | Disposition: A | Discharge status: Home / Self Care | Payer: 59 | Source: Ambulatory Visit | Attending: Obstetrics and Gynecology | Admitting: Obstetrics and Gynecology

## 2021-10-18 DIAGNOSIS — Z124 Encounter for screening for malignant neoplasm of cervix: Secondary | ICD-10-CM | POA: Diagnosis not present

## 2021-10-18 DIAGNOSIS — Z1231 Encounter for screening mammogram for malignant neoplasm of breast: Secondary | ICD-10-CM

## 2021-10-18 DIAGNOSIS — Z01419 Encounter for gynecological examination (general) (routine) without abnormal findings: Secondary | ICD-10-CM | POA: Diagnosis not present

## 2022-09-26 ENCOUNTER — Other Ambulatory Visit: Payer: Self-pay

## 2022-09-26 DIAGNOSIS — Z0000 Encounter for general adult medical examination without abnormal findings: Secondary | ICD-10-CM

## 2022-09-26 DIAGNOSIS — Z130 Encounter for screening for diseases of the blood and blood-forming organs and certain disorders involving the immune mechanism: Secondary | ICD-10-CM

## 2022-09-27 ENCOUNTER — Other Ambulatory Visit: Payer: 59

## 2022-09-27 DIAGNOSIS — Z1321 Encounter for screening for nutritional disorder: Secondary | ICD-10-CM | POA: Diagnosis not present

## 2022-09-27 DIAGNOSIS — Z130 Encounter for screening for diseases of the blood and blood-forming organs and certain disorders involving the immune mechanism: Secondary | ICD-10-CM

## 2022-09-27 DIAGNOSIS — Z13228 Encounter for screening for other metabolic disorders: Secondary | ICD-10-CM | POA: Diagnosis not present

## 2022-09-27 DIAGNOSIS — Z1329 Encounter for screening for other suspected endocrine disorder: Secondary | ICD-10-CM | POA: Diagnosis not present

## 2022-09-27 DIAGNOSIS — Z0000 Encounter for general adult medical examination without abnormal findings: Secondary | ICD-10-CM

## 2022-10-10 ENCOUNTER — Encounter: Payer: Commercial Managed Care - PPO | Admitting: Family Medicine

## 2022-10-17 ENCOUNTER — Other Ambulatory Visit (HOSPITAL_COMMUNITY): Payer: Self-pay

## 2022-10-17 ENCOUNTER — Ambulatory Visit (INDEPENDENT_AMBULATORY_CARE_PROVIDER_SITE_OTHER): Payer: 59 | Admitting: Family Medicine

## 2022-10-17 ENCOUNTER — Encounter: Payer: Self-pay | Admitting: Family Medicine

## 2022-10-17 VITALS — BP 114/82 | HR 85 | Ht 60.0 in | Wt 158.0 lb

## 2022-10-17 DIAGNOSIS — G43909 Migraine, unspecified, not intractable, without status migrainosus: Secondary | ICD-10-CM | POA: Insufficient documentation

## 2022-10-17 DIAGNOSIS — E878 Other disorders of electrolyte and fluid balance, not elsewhere classified: Secondary | ICD-10-CM

## 2022-10-17 DIAGNOSIS — G43009 Migraine without aura, not intractable, without status migrainosus: Secondary | ICD-10-CM | POA: Diagnosis not present

## 2022-10-17 DIAGNOSIS — Z87442 Personal history of urinary calculi: Secondary | ICD-10-CM | POA: Diagnosis not present

## 2022-10-17 MED ORDER — SUMATRIPTAN SUCCINATE 50 MG PO TABS
50.0000 mg | ORAL_TABLET | ORAL | 0 refills | Status: AC | PRN
  Filled 2022-10-17: qty 10, 30d supply, fill #0

## 2022-10-17 NOTE — Patient Instructions (Signed)
It was nice to see you today,  We addressed the following topics today: -I have sent in a prescription for sumatriptan.  You can take this when you have your next headache.  Take it orally and if you have a headache still 2 hours later take a second dose.  Do not take more than 2 doses in 1 day.  Try this on at least 2 different occasions before deciding if it is going to work or not.  If it does not work we can send in a prescription for Fioricet.  We would just need to call us and let us know - Please schedule a follow-up lab appointment for a low bicarbonate level that was identified in your BMP.  This is likely just an incidental finding but I we will need to repeat it to make sure it has resolved.  Have a great day,  Frederic Jericho, MD

## 2022-10-17 NOTE — Progress Notes (Signed)
Established Patient Office Visit  Subjective   Patient ID: Heather Knight, female    DOB: 06/26/1980  Age: 42 y.o. MRN: [id number]  Chief Complaint  Patient presents with   Annual Exam    HPI Patient is here for her yearly physical.  Does not take any medications has a Mirena IUD that was placed in 2017.  Only concern at this time is worsening headaches.  Patient is married, has a child.  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  Patient complaining of worsening migraine headaches recently.  Patient previously had much more frequent headaches before having her IUD placed 7 years ago.  Headaches only occurred 1-2 times a year after that but in the past year or so she has had increased frequency of headaches.  Headaches is usually right-sided behind the eye and frontotemporal.  No nausea.  Will take ibuprofen, acetaminophen as needed.  If that does not help we will take Excedrin Migraine.  Has taken Fioricet on 2 different occasions now and this has helped make the headaches more tolerable.  Previously took Fioricet several years ago for migraines.  Discussed risk.  Different migrated prophylactic and abortive treatment options.  Patient willing to try a triptan.  If this does not help patient okay with taking Fioricet.  Patient has routine Pap smears and gets yearly mammograms.  Has had 2 biopsies in the past.  Mother had breast cancer diagnosed at 70 years old.  Patient has a history of kidney stones and was told she has a slight UPJ obstruction many years ago.  Has not had kidney stones in the past 15 years.   The 10-year ASCVD risk score (Arnett DK, et al., 2019) is: 0.8%  Health Maintenance Due  Topic Date Due   Hepatitis C Screening  Never done   COVID-19 Vaccine (3 - 2023-24 season) 10/26/2021   INFLUENZA VACCINE  09/26/2022      Objective:     BP 114/82   Pulse 85   Ht 5' (1.524 m)   Wt 158 lb (71.7 kg)   SpO2 98%   BMI  30.86 kg/m    Physical Exam General: Alert, oriented HEENT: PERRLA, EOMI, moist mucous CV: Regular rate and rhythm Pulmonary: Lungs clear bilaterally GI: Soft, nontender.  Normal bowel sounds. MSK strength equal bilaterally EXTR: No pedal edema. Psych: Pleasant affect, spontaneous speech, maintains eye contact.   No results found for any visits on 10/17/22.      Assessment & Plan:   Low bicarbonate -     Basic metabolic panel; Future  Migraine without aura and without status migrainosus, not intractable Assessment & Plan: Will try sumatriptan for abortive treatment.  Patient declines prophylactic treatment at the moment.  If sumatriptan not effective patient agreeable to going back to Fioricet again.  Incidentally, patient noticed that migraines improved drastically after placement of IUD and now that her IUD is been in for 7 years and due for replacement she has noticed increased frequency again of headaches.  Recommended that patient should talk to her gynecologist about replacing her IUD.    History of kidney stones  Other orders -     SUMAtriptan Succinate; Take 1 tablet (50 mg total) by mouth every 2 (two) hours as needed for migraine. May repeat in 2 hours if headache persists or recurs.  Dispense: 10 tablet; Refill: 0     Return in about 6 months (around 04/19/2023)  for Migraines.    Sandre Kitty, MD

## 2022-10-17 NOTE — Assessment & Plan Note (Signed)
Will try sumatriptan for abortive treatment.  Patient declines prophylactic treatment at the moment.  If sumatriptan not effective patient agreeable to going back to Fioricet again.  Incidentally, patient noticed that migraines improved drastically after placement of IUD and now that her IUD is been in for 7 years and due for replacement she has noticed increased frequency again of headaches.  Recommended that patient should talk to her gynecologist about replacing her IUD.

## 2022-10-18 ENCOUNTER — Other Ambulatory Visit: Payer: 59

## 2022-10-18 DIAGNOSIS — E878 Other disorders of electrolyte and fluid balance, not elsewhere classified: Secondary | ICD-10-CM

## 2022-10-19 LAB — BASIC METABOLIC PANEL
BUN/Creatinine Ratio: 14 (ref 9–23)
BUN: 11 mg/dL (ref 6–24)
CO2: 20 mmol/L (ref 20–29)
Calcium: 9.2 mg/dL (ref 8.7–10.2)
Chloride: 104 mmol/L (ref 96–106)
Creatinine, Ser: 0.78 mg/dL (ref 0.57–1.00)
Glucose: 83 mg/dL (ref 70–99)
Potassium: 4.1 mmol/L (ref 3.5–5.2)
Sodium: 138 mmol/L (ref 134–144)
eGFR: 96 mL/min/{1.73_m2} (ref >59)

## 2022-11-14 ENCOUNTER — Other Ambulatory Visit: Payer: Self-pay | Admitting: Obstetrics and Gynecology

## 2022-11-14 DIAGNOSIS — Z1231 Encounter for screening mammogram for malignant neoplasm of breast: Secondary | ICD-10-CM

## 2022-11-29 ENCOUNTER — Encounter: Payer: Self-pay | Admitting: Family Medicine

## 2022-11-29 ENCOUNTER — Other Ambulatory Visit: Payer: Self-pay | Admitting: Family Medicine

## 2022-11-29 ENCOUNTER — Other Ambulatory Visit (HOSPITAL_COMMUNITY): Payer: Self-pay

## 2022-11-29 MED ORDER — BUTALBITAL-APAP-CAFFEINE 50-325-40 MG PO TABS
1.0000 | ORAL_TABLET | Freq: Four times a day (QID) | ORAL | 1 refills | Status: AC | PRN
  Filled 2022-11-29: qty 14, 4d supply, fill #0

## 2022-12-02 ENCOUNTER — Other Ambulatory Visit (HOSPITAL_COMMUNITY): Payer: Self-pay

## 2022-12-06 ENCOUNTER — Ambulatory Visit
Admission: RE | Admit: 2022-12-06 | Discharge: 2022-12-06 | Disposition: A | Discharge status: Home / Self Care | Payer: 59 | Source: Ambulatory Visit | Attending: Obstetrics and Gynecology | Admitting: Obstetrics and Gynecology

## 2022-12-06 DIAGNOSIS — Z1231 Encounter for screening mammogram for malignant neoplasm of breast: Secondary | ICD-10-CM

## 2022-12-19 ENCOUNTER — Ambulatory Visit: Payer: 59

## 2023-02-06 DIAGNOSIS — Z01419 Encounter for gynecological examination (general) (routine) without abnormal findings: Secondary | ICD-10-CM | POA: Diagnosis not present

## 2023-04-03 DIAGNOSIS — Z30433 Encounter for removal and reinsertion of intrauterine contraceptive device: Secondary | ICD-10-CM | POA: Diagnosis not present

## 2023-04-18 ENCOUNTER — Ambulatory Visit: Payer: 59 | Admitting: Family Medicine

## 2023-06-06 DIAGNOSIS — Z30431 Encounter for routine checking of intrauterine contraceptive device: Secondary | ICD-10-CM | POA: Diagnosis not present

## 2023-12-04 ENCOUNTER — Other Ambulatory Visit: Payer: Self-pay | Admitting: Obstetrics and Gynecology

## 2023-12-04 DIAGNOSIS — Z1231 Encounter for screening mammogram for malignant neoplasm of breast: Secondary | ICD-10-CM

## 2023-12-15 ENCOUNTER — Other Ambulatory Visit (HOSPITAL_COMMUNITY): Payer: Self-pay

## 2023-12-15 MED ORDER — ALPRAZOLAM 0.5 MG PO TABS
0.5000 mg | ORAL_TABLET | Freq: Three times a day (TID) | ORAL | 1 refills | Status: AC
  Filled 2023-12-15: qty 30, 10d supply, fill #0

## 2023-12-18 ENCOUNTER — Ambulatory Visit
Admission: RE | Admit: 2023-12-18 | Discharge: 2023-12-18 | Disposition: A | Discharge status: Home / Self Care | Source: Ambulatory Visit | Attending: Obstetrics and Gynecology | Admitting: Obstetrics and Gynecology

## 2023-12-18 DIAGNOSIS — Z1231 Encounter for screening mammogram for malignant neoplasm of breast: Secondary | ICD-10-CM

## 2024-02-13 DIAGNOSIS — Z01419 Encounter for gynecological examination (general) (routine) without abnormal findings: Secondary | ICD-10-CM | POA: Diagnosis not present
